# Patient Record
Sex: Male | Born: 1985 | Race: Black or African American | Hispanic: No | Marital: Single | Smoking: Never smoker
Health system: Southern US, Community
[De-identification: ages and names within clinical notes are randomized; demographics above are authoritative.]

## PROBLEM LIST (undated history)

## (undated) DIAGNOSIS — F1090 Alcohol use, unspecified, uncomplicated: Secondary | ICD-10-CM

## (undated) DIAGNOSIS — Z789 Other specified health status: Secondary | ICD-10-CM

## (undated) DIAGNOSIS — A630 Anogenital (venereal) warts: Secondary | ICD-10-CM

## (undated) DIAGNOSIS — J302 Other seasonal allergic rhinitis: Secondary | ICD-10-CM

## (undated) DIAGNOSIS — Z7289 Other problems related to lifestyle: Secondary | ICD-10-CM

## (undated) HISTORY — DX: Other specified health status: Z78.9

## (undated) HISTORY — DX: Other seasonal allergic rhinitis: J30.2

## (undated) HISTORY — DX: Other problems related to lifestyle: Z72.89

## (undated) HISTORY — DX: Alcohol use, unspecified, uncomplicated: F10.90

## (undated) HISTORY — DX: Anogenital (venereal) warts: A63.0

## (undated) HISTORY — PX: NO PAST SURGERIES: SHX2092

---

## 2010-08-23 ENCOUNTER — Emergency Department (HOSPITAL_COMMUNITY): Payer: Self-pay

## 2010-08-23 ENCOUNTER — Emergency Department (HOSPITAL_COMMUNITY)
Admission: EM | Admit: 2010-08-23 | Discharge: 2010-08-23 | Disposition: A | Discharge status: Home / Self Care | Payer: Self-pay | Attending: Emergency Medicine | Admitting: Emergency Medicine

## 2010-08-23 DIAGNOSIS — S61409A Unspecified open wound of unspecified hand, initial encounter: Secondary | ICD-10-CM | POA: Insufficient documentation

## 2010-08-23 DIAGNOSIS — W268XXA Contact with other sharp object(s), not elsewhere classified, initial encounter: Secondary | ICD-10-CM | POA: Insufficient documentation

## 2011-12-11 ENCOUNTER — Emergency Department (INDEPENDENT_AMBULATORY_CARE_PROVIDER_SITE_OTHER)
Admission: EM | Admit: 2011-12-11 | Discharge: 2011-12-11 | Disposition: A | Discharge status: Home / Self Care | Payer: 59 | Source: Home / Self Care | Attending: Family Medicine | Admitting: Family Medicine

## 2011-12-11 ENCOUNTER — Encounter (HOSPITAL_COMMUNITY): Payer: Self-pay | Admitting: Emergency Medicine

## 2011-12-11 DIAGNOSIS — N342 Other urethritis: Secondary | ICD-10-CM

## 2011-12-11 MED ORDER — LIDOCAINE HCL (PF) 1 % IJ SOLN
INTRAMUSCULAR | Status: AC
  Filled 2011-12-11: qty 5

## 2011-12-11 MED ORDER — CEFTRIAXONE SODIUM 250 MG IJ SOLR
250.0000 mg | Freq: Once | INTRAMUSCULAR | Status: AC
  Administered 2011-12-11: 250 mg via INTRAMUSCULAR

## 2011-12-11 MED ORDER — AZITHROMYCIN 250 MG PO TABS
ORAL_TABLET | ORAL | Status: AC
  Filled 2011-12-11: qty 4

## 2011-12-11 MED ORDER — CEFTRIAXONE SODIUM 250 MG IJ SOLR
INTRAMUSCULAR | Status: AC
  Filled 2011-12-11: qty 250

## 2011-12-11 MED ORDER — AZITHROMYCIN 250 MG PO TABS
1000.0000 mg | ORAL_TABLET | Freq: Once | ORAL | Status: AC
  Administered 2011-12-11: 1000 mg via ORAL

## 2011-12-11 NOTE — ED Notes (Signed)
Burning with urination.  Denies pain without urinating, denies penile discharge.  Patient reports having unprotected sex.

## 2011-12-11 NOTE — ED Notes (Signed)
Patient aware of post antibiotic injection delay prior to being discharged per policy

## 2011-12-11 NOTE — ED Provider Notes (Signed)
History     CSN: [id number]  Arrival date & time 12/11/11  1132   First MD Initiated Contact with Patient 12/11/11 1216      Chief Complaint  Patient presents with  . Urinary Tract Infection    (Consider location/radiation/quality/duration/timing/severity/associated sxs/prior treatment) HPI Comments: 26 year old male with no significant past medical history here complaining of burning on urination for 2 days. Reports more than 5 heterosexual partners during the last year although states that he had use condoms every time except with his last sexual partner during the last month. Denies prior history of sexually transmitted diseases. Declined HIV test.   History reviewed. No pertinent past medical history.  History reviewed. No pertinent past surgical history.  No family history on file.  History  Substance Use Topics  . Smoking status: Never Smoker   . Smokeless tobacco: Not on file  . Alcohol Use: Yes      Review of Systems  Constitutional: Negative for fever, chills, fatigue and unexpected weight change.  HENT: Negative for congestion, sore throat and neck pain.   Respiratory: Negative for cough and shortness of breath.   Gastrointestinal: Negative for nausea, vomiting, abdominal pain and diarrhea.  Genitourinary: Positive for dysuria, frequency and discharge. Negative for urgency, hematuria, flank pain, decreased urine volume, penile swelling, scrotal swelling, genital sores, penile pain and testicular pain.  Neurological: Negative for dizziness and headaches.  All other systems reviewed and are negative.    Allergies  Review of patient's allergies indicates no known allergies.  Home Medications  No current outpatient prescriptions on file.  BP 132/89  Pulse 53  Temp 98.1 F (36.7 C) (Oral)  Resp 18  SpO2 100%  Physical Exam  Nursing note and vitals reviewed. Constitutional: He is oriented to person, place, and time. He appears well-developed and  well-nourished. No distress.  HENT:  Head: Normocephalic and atraumatic.  Mouth/Throat: No oropharyngeal exudate.  Eyes: Conjunctivae are normal. No scleral icterus.  Neck: Neck supple.  Cardiovascular: Normal heart sounds.   Pulmonary/Chest: Breath sounds normal.  Abdominal: Soft. There is no tenderness. Hernia confirmed negative in the right inguinal area and confirmed negative in the left inguinal area.  Genitourinary: Testes normal. Right testis shows no mass, no swelling and no tenderness. Left testis shows no mass, no swelling and no tenderness. Circumcised. No phimosis, hypospadias or penile erythema. Discharge found.  Lymphadenopathy:    He has no cervical adenopathy.       Right: No inguinal adenopathy present.       Left: No inguinal adenopathy present.  Neurological: He is alert and oriented to person, place, and time.  Skin: No rash noted.    ED Course  Procedures (including critical care time)  Labs Reviewed - No data to display No results found.   1. Urethritis       MDM  Clinical findings consistent with urethritis. Treated empirically for gonorrhea and Chlamydia with Rocephin 250 mg IM x1 and a azithromycin 1 g oral today. GC and Chlamydia tests results pending at the time of discharge. Patient encouraged to use condoms consistently to prevent sexually transmitted diseases. Declined HIV test.        Sharin Grave, MD 12/13/11 1500

## 2011-12-15 ENCOUNTER — Emergency Department (INDEPENDENT_AMBULATORY_CARE_PROVIDER_SITE_OTHER)
Admission: EM | Admit: 2011-12-15 | Discharge: 2011-12-15 | Disposition: A | Discharge status: Home / Self Care | Payer: 59 | Source: Home / Self Care | Attending: Family Medicine | Admitting: Family Medicine

## 2011-12-15 ENCOUNTER — Encounter (HOSPITAL_COMMUNITY): Payer: Self-pay | Admitting: Emergency Medicine

## 2011-12-15 DIAGNOSIS — N341 Nonspecific urethritis: Secondary | ICD-10-CM

## 2011-12-15 LAB — POCT URINALYSIS DIP (DEVICE)
Ketones, ur: NEGATIVE mg/dL
Protein, ur: 100 mg/dL — AB
Specific Gravity, Urine: >=1.03 (ref 1.005–1.030)
Urobilinogen, UA: 0.2 mg/dL (ref 0.0–1.0)

## 2011-12-15 MED ORDER — DOXYCYCLINE HYCLATE 100 MG PO CAPS: 100.0000 mg | ORAL_CAPSULE | Freq: Two times a day (BID) | ORAL | Status: AC

## 2011-12-15 NOTE — ED Notes (Signed)
Burning with urination.  Reports not feeling any better since visit on 8/15.

## 2011-12-15 NOTE — ED Provider Notes (Signed)
History     CSN: [id number]  Arrival date & time 12/15/11  1714   First MD Initiated Contact with Patient 12/15/11 1716      Chief Complaint  Patient presents with  . Exposure to STD    (Consider location/radiation/quality/duration/timing/severity/associated sxs/prior treatment) Patient is a 26 y.o. male presenting with STD exposure. The history is provided by the patient.  Exposure to STD This is a recurrent problem. The current episode started more than 1 week ago (seen 8/15 for std and treated but sx continue of dysuria.). The problem has been gradually worsening.    History reviewed. No pertinent past medical history.  History reviewed. No pertinent past surgical history.  No family history on file.  History  Substance Use Topics  . Smoking status: Never Smoker   . Smokeless tobacco: Not on file  . Alcohol Use: Yes      Review of Systems  Constitutional: Negative.   Gastrointestinal: Negative.   Genitourinary: Positive for dysuria. Negative for urgency, discharge, penile swelling, scrotal swelling, penile pain and testicular pain.    Allergies  Review of patient's allergies indicates no known allergies.  Home Medications   Current Outpatient Rx  Name Route Sig Dispense Refill  . DOXYCYCLINE HYCLATE 100 MG PO CAPS Oral Take 1 capsule (100 mg total) by mouth 2 (two) times daily. 20 capsule 0    BP 150/96  Pulse 55  Temp 99.1 F (37.3 C) (Oral)  Resp 16  SpO2 100%  Physical Exam  Nursing note and vitals reviewed. Constitutional: He appears well-developed and well-nourished.  Abdominal: Soft. Bowel sounds are normal.  Genitourinary: Penis normal.    No penile tenderness.    ED Course  Procedures (including critical care time)  Labs Reviewed  POCT URINALYSIS DIP (DEVICE) - Abnormal; Notable for the following:    Hgb urine dipstick LARGE (*)     Protein, ur 100 (*)     All other components within normal limits   No results found.   1.  Urethritis, nonspecific       MDM          Linna Hoff, MD 12/15/11 1753

## 2012-01-02 IMAGING — CR DG HAND COMPLETE 3+V*L*
3 series · 3 of 3 positions shown · non-contrast
Comparison: None.

CLINICAL DATA: Laceration to left hand, just below the fifth
finger, extending to the wrist.

LEFT HAND - COMPLETE 3+ VIEW

[x hand pa right]
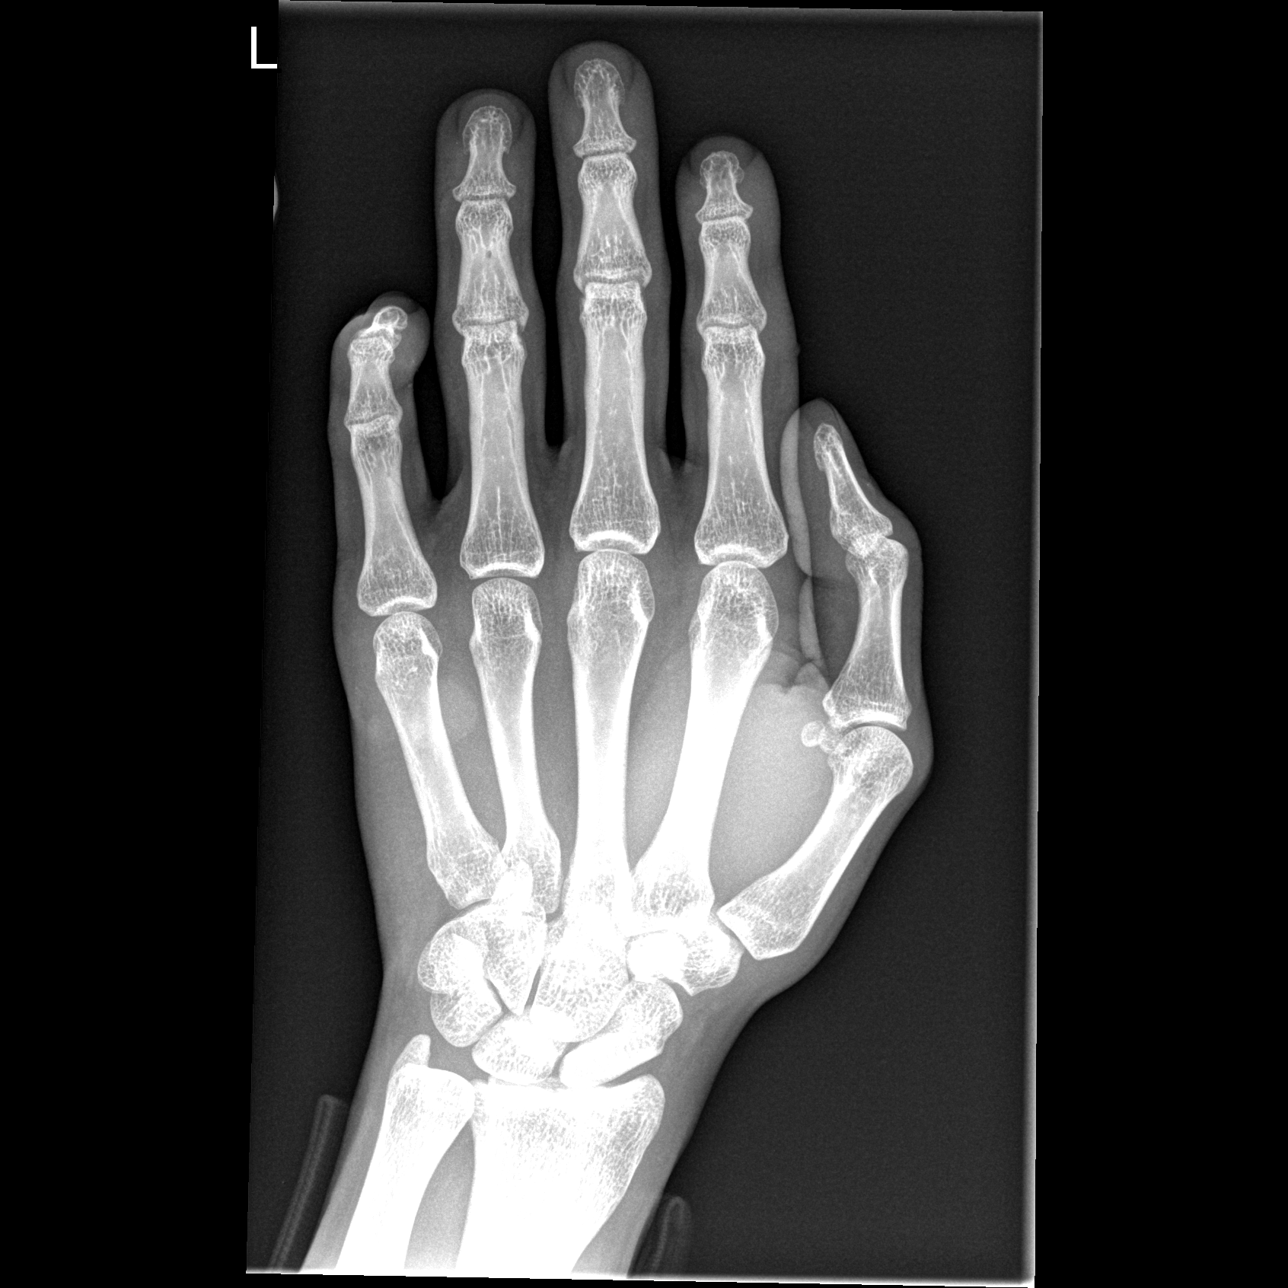

[x hand oblique right]
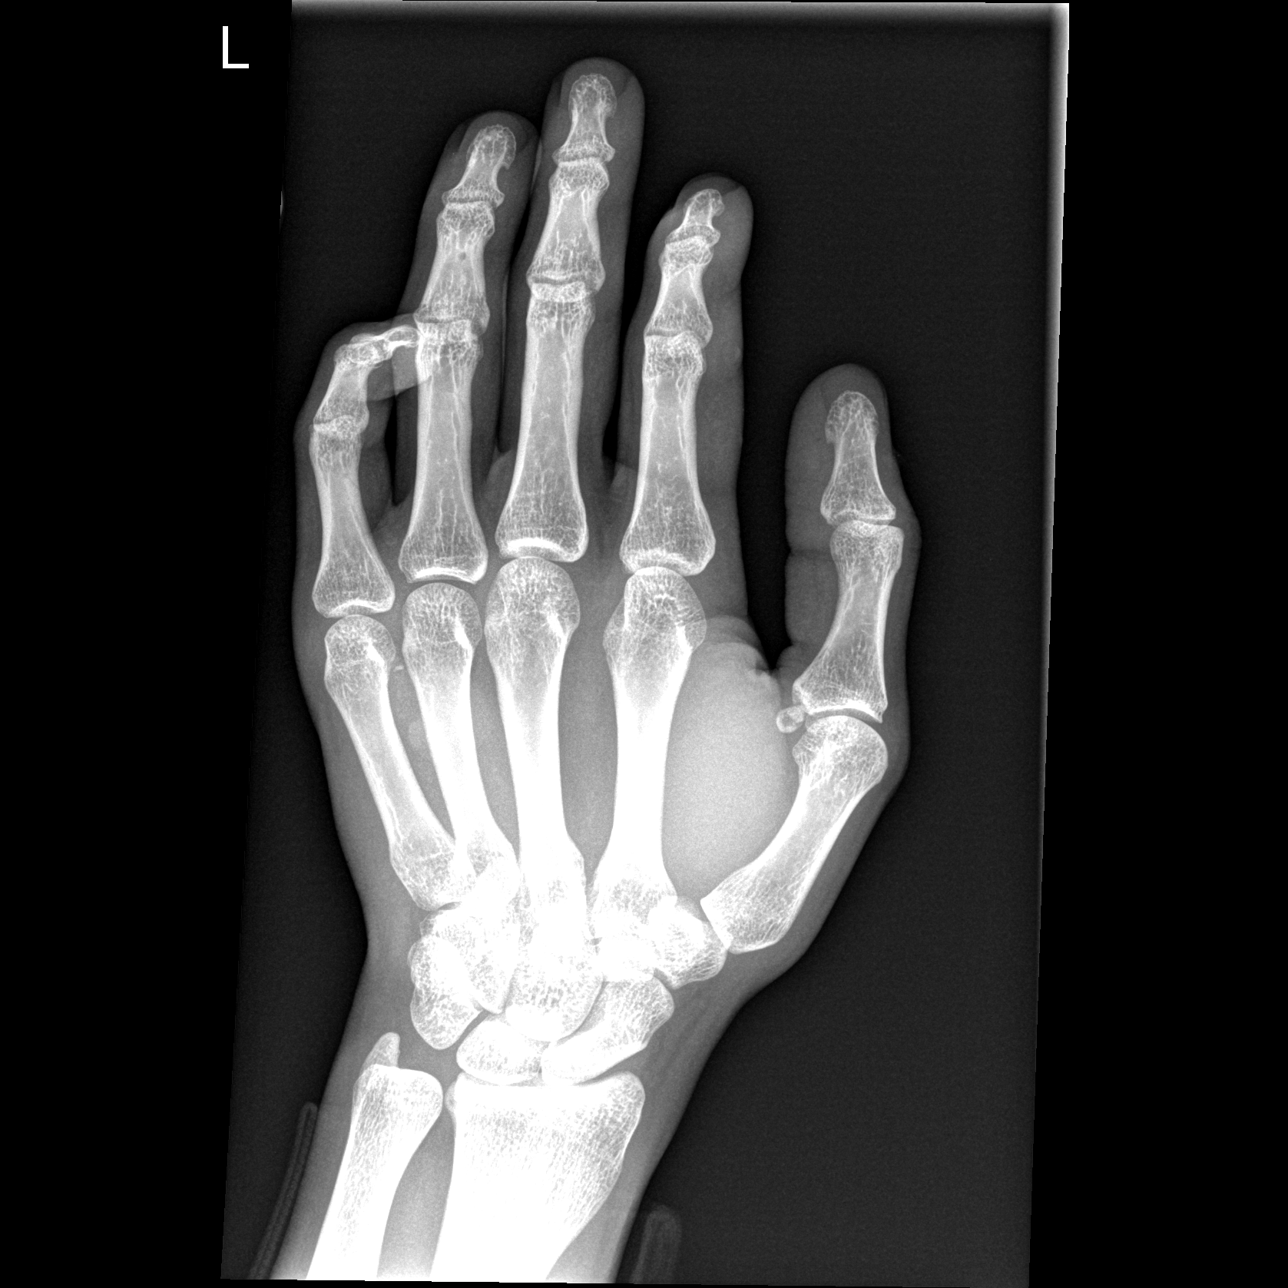

[x hand lat right]
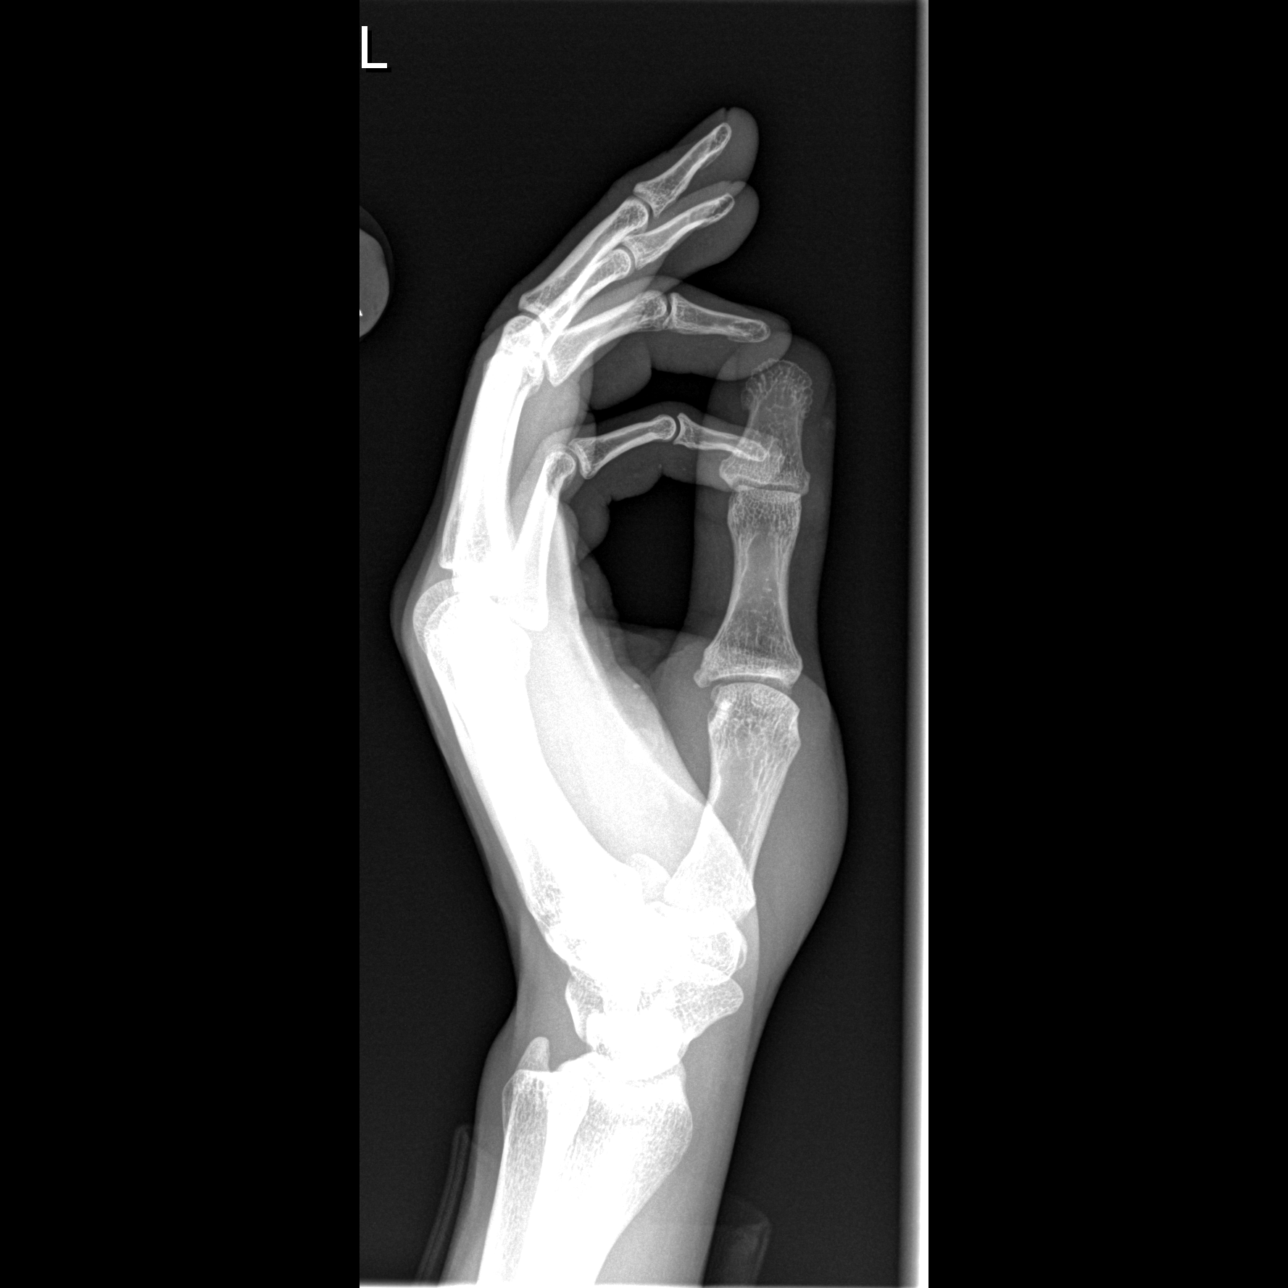

[3 of 3 positions shown; findings below may reference images not displayed]

FINDINGS: There is no evidence of fracture or dislocation.  The
joint spaces are preserved.  The carpal rows are intact, and
demonstrate normal alignment.

Two tiny radiopaque densities are noted at the expected location of
the distal aspect of the laceration, measuring less than 2 mm in
size.  There is also a rounded soft tissue density projecting volar
to the fifth metacarpal, possibly reflecting a soft tissue
hematoma.
IMPRESSION: 1.  No evidence of osseous disruption.
2.  Suspect two tiny radiopaque foreign bodies at the distal aspect
of the laceration, measuring less than 2 mm in size.  Small rounded
soft tissue density volar to the fifth metacarpal may reflect a
soft tissue hematoma.

## 2012-09-26 ENCOUNTER — Emergency Department (INDEPENDENT_AMBULATORY_CARE_PROVIDER_SITE_OTHER)
Admission: EM | Admit: 2012-09-26 | Discharge: 2012-09-26 | Disposition: A | Discharge status: Home / Self Care | Payer: 59 | Source: Home / Self Care | Attending: Family Medicine | Admitting: Family Medicine

## 2012-09-26 ENCOUNTER — Encounter (HOSPITAL_COMMUNITY): Payer: Self-pay | Admitting: Emergency Medicine

## 2012-09-26 DIAGNOSIS — J302 Other seasonal allergic rhinitis: Secondary | ICD-10-CM

## 2012-09-26 DIAGNOSIS — J309 Allergic rhinitis, unspecified: Secondary | ICD-10-CM

## 2012-09-26 MED ORDER — METHYLPREDNISOLONE ACETATE 40 MG/ML IJ SUSP
80.0000 mg | Freq: Once | INTRAMUSCULAR | Status: AC
  Administered 2012-09-26: 80 mg via INTRAMUSCULAR

## 2012-09-26 MED ORDER — FLUTICASONE PROPIONATE 50 MCG/ACT NA SUSP: 1.0000 | Freq: Two times a day (BID) | NASAL | Status: DC

## 2012-09-26 MED ORDER — METHYLPREDNISOLONE ACETATE 80 MG/ML IJ SUSP
INTRAMUSCULAR | Status: AC
  Filled 2012-09-26: qty 1

## 2012-09-26 MED ORDER — AZELASTINE HCL 0.15 % NA SOLN: 2.0000 | Freq: Two times a day (BID) | NASAL | Status: DC

## 2012-09-26 NOTE — ED Provider Notes (Signed)
History     CSN: [id number]  Arrival date & time 09/26/12  1103   First MD Initiated Contact with Patient 09/26/12 1138      Chief Complaint  Patient presents with  . Nasal Congestion    x 3 wks.     (Consider location/radiation/quality/duration/timing/severity/associated sxs/prior treatment) Patient is a 27 y.o. male presenting with URI. The history is provided by the patient.  URI Presenting symptoms: congestion, cough and rhinorrhea   Presenting symptoms: no fever and no sore throat   Severity:  Mild Duration:  3 weeks Progression:  Unchanged Chronicity:  New Ineffective treatments:  OTC medications Associated symptoms: sneezing   Associated symptoms: no wheezing     History reviewed. No pertinent past medical history.  History reviewed. No pertinent past surgical history.  History reviewed. No pertinent family history.  History  Substance Use Topics  . Smoking status: Never Smoker   . Smokeless tobacco: Not on file  . Alcohol Use: Yes      Review of Systems  Constitutional: Negative.  Negative for fever.  HENT: Positive for congestion, rhinorrhea, sneezing and postnasal drip. Negative for sore throat.   Respiratory: Positive for cough. Negative for wheezing.   Cardiovascular: Negative.     Allergies  Review of patient's allergies indicates no known allergies.  Home Medications   Current Outpatient Rx  Name  Route  Sig  Dispense  Refill  . Azelastine HCl 0.15 % SOLN   Nasal   Place 2 sprays into the nose 2 (two) times daily.   30 mL   1   . fluticasone (FLONASE) 50 MCG/ACT nasal spray   Nasal   Place 1 spray into the nose 2 (two) times daily.   1 g   2     BP 120/66  Pulse 64  Temp(Src) 97.8 F (36.6 C) (Oral)  Resp 16  SpO2 98%  Physical Exam  Nursing note and vitals reviewed. Constitutional: He is oriented to person, place, and time. He appears well-developed and well-nourished.  HENT:  Head: Normocephalic.  Right Ear: External  ear normal.  Left Ear: External ear normal.  Nose: Mucosal edema and rhinorrhea present.  Mouth/Throat: Oropharynx is clear and moist.  Neck: Normal range of motion. Neck supple.  Pulmonary/Chest: Effort normal and breath sounds normal.  Lymphadenopathy:    He has no cervical adenopathy.  Neurological: He is alert and oriented to person, place, and time.  Skin: Skin is warm and dry.    ED Course  Procedures (including critical care time)  Labs Reviewed - No data to display No results found.   1. Seasonal allergic rhinitis       MDM          Linna Hoff, MD 09/26/12 1208

## 2012-09-26 NOTE — ED Notes (Signed)
Pt c/o severe nasal congestion x 3 wks.   Pt states when he is able to blow nose mucus is dark yellow.   Hard to breath/sob.   Pt has tried multiple otc meds with no relief.

## 2012-09-26 NOTE — ED Notes (Signed)
Pt denies fever and any other symptoms.  

## 2015-10-15 ENCOUNTER — Telehealth: Payer: Self-pay

## 2015-10-15 NOTE — Telephone Encounter (Signed)
Called patient to complete Pre Visit. Voice mail not set up.

## 2015-10-16 ENCOUNTER — Encounter: Payer: Self-pay | Admitting: Physician Assistant

## 2015-10-16 ENCOUNTER — Ambulatory Visit (INDEPENDENT_AMBULATORY_CARE_PROVIDER_SITE_OTHER): Payer: BLUE CROSS/BLUE SHIELD | Admitting: Physician Assistant

## 2015-10-16 VITALS — BP 122/86 | HR 62 | Temp 98.0°F | Resp 16 | Ht 71.75 in | Wt 163.0 lb

## 2015-10-16 DIAGNOSIS — A630 Anogenital (venereal) warts: Secondary | ICD-10-CM | POA: Diagnosis not present

## 2015-10-16 DIAGNOSIS — F1010 Alcohol abuse, uncomplicated: Secondary | ICD-10-CM | POA: Diagnosis not present

## 2015-10-16 NOTE — Progress Notes (Signed)
Pre visit review using our clinic review tool, if applicable. No additional management support is needed unless otherwise documented below in the visit note/SLS  

## 2015-10-16 NOTE — Progress Notes (Signed)
Patient presents to clinic today to establish care.  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 Was unable to get treated due to insurance. Was previously treated with Condylox in a provider's office.   Endorses alcohol abuse. Drinking 1 pint of liquor per day over the past 2 years. Has never tried to quit. Is now entering into a rehabilitation program that requires he start to wean off of medication. Would like to discuss treatment options today.  C - yes A - no G - no E - yes   Past Medical History  Diagnosis Date  . Seasonal allergies   . Genital warts   . Alcohol use [facility](HCC)     Past Surgical History  Procedure Laterality Date  . No past surgeries      No current outpatient prescriptions on file prior to visit.   No current facility-administered medications on file prior to visit.    No Known Allergies  Family History  Problem Relation Age of Onset  . Hypertension Father     Living  . Healthy Mother     living  . Stroke Paternal Grandmother   . Hypertension Paternal Grandmother   . Hyperlipidemia Other     Paternal & Maternal Uncles  . Anxiety disorder Sister   . Healthy Sister     #2  . Healthy Son     x1  . Healthy Daughter     x1    Social History   Social History  . Marital Status: Single    Spouse Name: N/A  . Number of Children: N/A  . Years of Education: N/A   Occupational History  . Not on file.   Social History Main Topics  . Smoking status: Never Smoker   . Smokeless tobacco: Never Used  . Alcohol Use: 0.0 oz/week    0 Standard drinks or equivalent per week     Comment: liquor -- pint of liquor/day x 2 years  . Drug Use: Yes    Special: Marijuana  . Sexual Activity:    Partners: Female    CopyBirth Control/ Protection: None   Other Topics Concern  . Not on file   Social History Narrative   Review of Systems  Constitutional: Negative for fever and weight loss.    Gastrointestinal: Negative for heartburn, nausea, vomiting, abdominal pain, diarrhea, constipation, blood in stool and melena.  Psychiatric/Behavioral: Positive for substance abuse. Negative for depression, suicidal ideas and hallucinations. The patient is not nervous/anxious and does not have insomnia.    BP 122/86 mmHg  Pulse 62  Temp(Src) 98 F (36.7 C) (Oral)  Resp 16  Ht 5' 11.75" (1.822 m)  Wt 163 lb (73.936 kg)  BMI 22.27 kg/m2  SpO2 99%  Physical Exam  Constitutional: He is oriented to person, place, and time and well-developed, well-nourished, and in no distress.  HENT:  Head: Normocephalic and atraumatic.  Eyes: Conjunctivae are normal.  Neck: Neck supple.  Cardiovascular: Normal rate, regular rhythm, normal heart sounds and intact distal pulses.   Pulmonary/Chest: Effort normal and breath sounds normal. No respiratory distress. He has no wheezes. He has no rales. He exhibits no tenderness.  Genitourinary:  Multiple verrucous lesions noted of pubic region. 2 around 1 cm in diameter at the base of the penile shaft, 2 of R inguinal region and 1 0.5 cm lesion of distal penile shaft. No urethral warts noted. No scrotal lesions noted.  Neurological: He  is alert and oriented to person, place, and time.  Skin: Skin is warm and dry. No rash noted.  Psychiatric: Affect normal.  Vitals reviewed.  Assessment/Plan: Genital warts Cryotherapy applied to 5 lesions. Discussed treatment options with patient. Imiquimod will likely be subtherapeutic giving size of lesions. Also cost is an issue. Condylox has increased risk of scarring.  Cryotherapy would need staging. Recommended electrocautery. Referral to Dermatology placed for further treatment. Discussed safe sex practices.   Alcohol abuse Significant. Score of 2 on cage questionnaire. Discussed that inpatient care would be optimal giving extent of use coupled with risk of withdrawal. Also discussed outpatient treatment with Topamax and  other medications to help with cravings and any agitation from withdrawals. He has appointment with rehab program. Will speak with them first regarding inpatient versus outpatient treatment.     Piedad Climes, PA-C

## 2015-10-16 NOTE — Assessment & Plan Note (Signed)
Cryotherapy applied to 5 lesions. Discussed treatment options with patient. Imiquimod will likely be subtherapeutic giving size of lesions. Also cost is an issue. Condylox has increased risk of scarring.  Cryotherapy would need staging. Recommended electrocautery. Referral to Dermatology placed for further treatment. Discussed safe sex practices.

## 2015-10-16 NOTE — Assessment & Plan Note (Signed)
Significant. Score of 2 on cage questionnaire. Discussed that inpatient care would be optimal giving extent of use coupled with risk of withdrawal. Also discussed outpatient treatment with Topamax and other medications to help with cravings and any agitation from withdrawals. He has appointment with rehab program. Will speak with them first regarding inpatient versus outpatient treatment.

## 2015-10-16 NOTE — Patient Instructions (Signed)
Please think about the options we discussed for alcohol use.  We need to wean off of this in a safe matter. I do think that inpatient treatment would be the safest way to quickly cease drinking.  For outpatient treatment, we could start Topamax to help curb your cravings. Other medications could be given at low doses for a short-time to help with any withdrawal agitation.  There is a medication called Disulfiram that we could give that would help stop drinking. Unfortunately it works by making you very sick if you have a drink. Do not think that is a great options.  Follow-up with your probation program and AA classes.  Please let me know quickly how you would like to proceed.  For the warts -- we applied cryotherapy today. This will take several treatments every 1-2 weeks for a couple of months. I am setting you up with Dermatology for further management as I think you would benefit from removal via electrocautery.  Genital Warts Genital warts are small growths in the genital area or anal area. They are caused by a type of germ (HPV virus). This germ is spread from person to person during sex. It can be spread through vaginal, anal, and oral sex. Genital warts can lead to other problems if they are not treated.  HOME CARE Medicines  Apply over-the-counter and prescription medicines only as told by your doctor.  Do not use medicines that are meant for treating hand warts.  Talk with your doctor about using anti-itch creams. General Instructions  Do not touch or scratch the warts.  Do not have sex until your treatment is done.  Tell your current and past sexual partners about your condition. They may need treatment.  Keep all follow-up visits as told by your doctor. This is important.  After treatment, use condoms during sex. Other Instructions for Women  Women who have genital warts may need to be checked more often for cervical cancer.  If you become pregnant, tell your doctor  that you have had genital warts. The germ can be passed to the baby. GET HELP IF:  You have redness, swelling, or pain in the area of the treated skin.  You have a fever.  You feel generally sick.  You feel lumps in and around your genital area or anal area.  You have bleeding in your genital area or anal area.  You have pain during sex.   This information is not intended to replace advice given to you by your health care provider. Make sure you discuss any questions you have with your health care provider.   Document Released: 07/09/2009 Document Revised: 01/03/2015 Document Reviewed: 07/10/2014 Elsevier Interactive Patient Education [employer]2016 [employer] Inc.

## 2015-11-20 ENCOUNTER — Encounter: Payer: Self-pay | Admitting: Physician Assistant

## 2015-11-20 ENCOUNTER — Ambulatory Visit (INDEPENDENT_AMBULATORY_CARE_PROVIDER_SITE_OTHER): Payer: BLUE CROSS/BLUE SHIELD | Admitting: Physician Assistant

## 2015-11-20 VITALS — BP 126/86 | HR 63 | Temp 98.4°F | Resp 16 | Ht 72.0 in | Wt 164.4 lb

## 2015-11-20 DIAGNOSIS — A630 Anogenital (venereal) warts: Secondary | ICD-10-CM

## 2015-11-20 NOTE — Progress Notes (Signed)
   Patient presents to clinic today for follow-up of genital warts. At last visit cryotherapy was applied to 3 verrucous lesions (2 R inguinal region, 1 penile shaft). Endorses some improvement in size of lesions, but only slightly so. Would like to discuss other options. Denies new or worsening lesions.   Past Medical History:  Diagnosis Date  . Alcohol use (HCC)   . Genital warts   . Seasonal allergies     No current outpatient prescriptions on file prior to visit.   No current facility-administered medications on file prior to visit.     No Known Allergies  Family History  Problem Relation Age of Onset  . Hypertension Father     Living  . Healthy Mother     living  . Stroke Paternal Grandmother   . Hypertension Paternal Grandmother   . Hyperlipidemia Other     Paternal & Maternal Uncles  . Anxiety disorder Sister   . Healthy Sister     #2  . Healthy Son     x1  . Healthy Daughter     x1    Social History   Social History  . Marital status: Single    Spouse name: N/A  . Number of children: N/A  . Years of education: N/A   Social History Main Topics  . Smoking status: Never Smoker  . Smokeless tobacco: Never Used  . Alcohol use 0.0 oz/week     Comment: liquor -- pint of liquor/day x 2 years  . Drug use:     Types: Marijuana  . Sexual activity: Yes    Partners: Female    Birth control/ protection: None   Other Topics Concern  . None   Social History Narrative  . None   Review of Systems - See HPI.  All other ROS are negative.  BP 126/86 (BP Location: Right Arm, Patient Position: Sitting, Cuff Size: Normal)   Pulse 63   Temp 98.4 F (36.9 C) (Oral)   Resp 16   Ht 6' (1.829 m)   Wt 164 lb 6 oz (74.6 kg)   SpO2 99%   BMI 22.29 kg/m   Physical Exam  Constitutional: He is oriented to person, place, and time and well-developed, well-nourished, and in no distress.  HENT:  Head: Normocephalic and atraumatic.  Eyes: Conjunctivae are normal.  Neck:  Neck supple.  Cardiovascular: Normal rate, regular rhythm, normal heart sounds and intact distal pulses.   Pulmonary/Chest: Effort normal.  Genitourinary: Penis exhibits lesions.  Genitourinary Comments: Again 2 verrucous lesion noted of R inguinal region. 1 similar lesion, about 4 mm in diameter, noted of penile shaft.  Neurological: He is alert and oriented to person, place, and time.  Skin: Skin is warm and dry.  Psychiatric: Affect normal.  Vitals reviewed.  Assessment/Plan: 1. Genital warts Discussed options. Believe again electrocautery is the best option in both cost and effectiveness. Replaced referral to Dermatology. Cryotherapy applied one more time to each lesion to help start facilitating regression.   - Ambulatory referral to Dermatology   Piedad Climes, PA-C

## 2015-11-20 NOTE — Patient Instructions (Signed)
Keep your phone on. If you do not here from the Dermatologist office within 48 hours for an appointment give me a call. Keep up the good work with your alcohol cessation efforts. Please give some thought to medication to help with cravings. I am very impressed with how you have done so far.  Follow-up at your convenience for a full physical examination.

## 2015-11-20 NOTE — Progress Notes (Signed)
Pre visit review using our clinic review tool, if applicable. No additional management support is needed unless otherwise documented below in the visit note/SLS  

## 2017-09-10 ENCOUNTER — Encounter: Payer: Self-pay | Admitting: Emergency Medicine

## 2020-06-11 ENCOUNTER — Ambulatory Visit (HOSPITAL_COMMUNITY)
Admission: EM | Admit: 2020-06-11 | Discharge: 2020-06-11 | Disposition: A | Discharge status: Other Acute Inpatient Hospital | Payer: No Payment, Other

## 2020-06-11 ENCOUNTER — Emergency Department (HOSPITAL_COMMUNITY)
Admission: EM | Admit: 2020-06-11 | Discharge: 2020-06-14 | Disposition: A | Discharge status: Home / Self Care | Payer: BLUE CROSS/BLUE SHIELD | Attending: Emergency Medicine | Admitting: Emergency Medicine

## 2020-06-11 ENCOUNTER — Encounter (HOSPITAL_COMMUNITY): Payer: Self-pay | Admitting: Emergency Medicine

## 2020-06-11 ENCOUNTER — Other Ambulatory Visit: Payer: Self-pay

## 2020-06-11 DIAGNOSIS — R4689 Other symptoms and signs involving appearance and behavior: Secondary | ICD-10-CM

## 2020-06-11 DIAGNOSIS — Z046 Encounter for general psychiatric examination, requested by authority: Secondary | ICD-10-CM | POA: Insufficient documentation

## 2020-06-11 DIAGNOSIS — F332 Major depressive disorder, recurrent severe without psychotic features: Secondary | ICD-10-CM | POA: Insufficient documentation

## 2020-06-11 DIAGNOSIS — F1024 Alcohol dependence with alcohol-induced mood disorder: Secondary | ICD-10-CM | POA: Insufficient documentation

## 2020-06-11 DIAGNOSIS — R45851 Suicidal ideations: Secondary | ICD-10-CM | POA: Insufficient documentation

## 2020-06-11 DIAGNOSIS — R45850 Homicidal ideations: Secondary | ICD-10-CM

## 2020-06-11 DIAGNOSIS — Z20822 Contact with and (suspected) exposure to covid-19: Secondary | ICD-10-CM | POA: Insufficient documentation

## 2020-06-11 DIAGNOSIS — F1010 Alcohol abuse, uncomplicated: Secondary | ICD-10-CM | POA: Diagnosis present

## 2020-06-11 DIAGNOSIS — R456 Violent behavior: Secondary | ICD-10-CM | POA: Insufficient documentation

## 2020-06-11 DIAGNOSIS — F1994 Other psychoactive substance use, unspecified with psychoactive substance-induced mood disorder: Secondary | ICD-10-CM

## 2020-06-11 LAB — CBC
HCT: 46.1 % (ref 39.0–52.0)
Hemoglobin: 16.8 g/dL (ref 13.0–17.0)
MCH: 35.2 pg — ABNORMAL HIGH (ref 26.0–34.0)
MCHC: 36.4 g/dL — ABNORMAL HIGH (ref 30.0–36.0)
MCV: 96.6 fL (ref 80.0–100.0)
Platelets: 334 10*3/uL (ref 150–400)
RBC: 4.77 MIL/uL (ref 4.22–5.81)
RDW: 12.2 % (ref 11.5–15.5)
WBC: 8.4 10*3/uL (ref 4.0–10.5)
nRBC: 0 % (ref 0.0–0.2)

## 2020-06-11 LAB — COMPREHENSIVE METABOLIC PANEL
ALT: 20 U/L (ref 0–44)
AST: 34 U/L (ref 15–41)
Albumin: 4.6 g/dL (ref 3.5–5.0)
Alkaline Phosphatase: 59 U/L (ref 38–126)
Anion gap: 15 (ref 5–15)
BUN: 6 mg/dL (ref 6–20)
CO2: 24 mmol/L (ref 22–32)
Calcium: 9.8 mg/dL (ref 8.9–10.3)
Chloride: 101 mmol/L (ref 98–111)
Creatinine, Ser: 1.05 mg/dL (ref 0.61–1.24)
GFR, Estimated: >60 mL/min (ref >60)
Glucose, Bld: 114 mg/dL — ABNORMAL HIGH (ref 70–99)
Potassium: 3.3 mmol/L — ABNORMAL LOW (ref 3.5–5.1)
Sodium: 140 mmol/L (ref 135–145)
Total Bilirubin: 1.3 mg/dL — ABNORMAL HIGH (ref 0.3–1.2)
Total Protein: 8.1 g/dL (ref 6.5–8.1)

## 2020-06-11 LAB — ETHANOL: Alcohol, Ethyl (B): 247 mg/dL — ABNORMAL HIGH (ref <10)

## 2020-06-11 LAB — ACETAMINOPHEN LEVEL: Acetaminophen (Tylenol), Serum: <10 ug/mL — ABNORMAL LOW (ref 10–30)

## 2020-06-11 LAB — SALICYLATE LEVEL: Salicylate Lvl: <7 mg/dL — ABNORMAL LOW (ref 7.0–30.0)

## 2020-06-11 MED ORDER — ZIPRASIDONE MESYLATE 20 MG IM SOLR: 20.0000 mg | Freq: Once | INTRAMUSCULAR | Status: AC

## 2020-06-11 MED ORDER — ZIPRASIDONE MESYLATE 20 MG IM SOLR
INTRAMUSCULAR | Status: AC
  Administered 2020-06-11: 20 mg via INTRAMUSCULAR
  Filled 2020-06-11: qty 20

## 2020-06-11 MED ORDER — STERILE WATER FOR INJECTION IJ SOLN
INTRAMUSCULAR | Status: AC
  Filled 2020-06-11: qty 10

## 2020-06-11 NOTE — ED Notes (Signed)
Patient angry and cussing. When asked for a urine sample patient states "what the f** do you need that for im not giving you shit."

## 2020-06-11 NOTE — ED Provider Notes (Signed)
[facility] EMERGENCY DEPARTMENT Provider Note   CSN: [id number] Arrival date & time: 06/11/20  2232     History Chief Complaint  Patient presents with  . IVC/Suicidal    Handcuffed w/ [employer]    Todd Matthews is a 35 y.o. male.  Patient to ED under IVC, accompanied by [employer], restrained due to aggressive behavior, HI, verbally abusive and threatening behavior. IVC petition filed by mother reporting he expressed SI today with plan that patient will not disclose, HI stating he wanted to kill his parents.   The history is provided by the patient. No language interpreter was used.       Past Medical History:  Diagnosis Date  . Alcohol use   . Genital warts   . Seasonal allergies     Patient Active Problem List   Diagnosis Date Noted  . Alcohol abuse 10/16/2015  . Genital warts 10/16/2015    Past Surgical History:  Procedure Laterality Date  . NO PAST SURGERIES         Family History  Problem Relation Age of Onset  . Hypertension Father        Living  . Healthy Mother        living  . Stroke Paternal Grandmother   . Hypertension Paternal Grandmother   . Hyperlipidemia Other        Paternal & Maternal Uncles  . Anxiety disorder Sister   . Healthy Sister        #2  . Healthy Son        x1  . Healthy Daughter        x1    Social History   Tobacco Use  . Smoking status: Never Smoker  . Smokeless tobacco: Never Used  Substance Use Topics  . Alcohol use: Yes    Alcohol/week: 0.0 standard drinks    Comment: liquor -- pint of liquor/day x 2 years  . Drug use: Yes    Types: Marijuana    Home Medications Prior to Admission medications   Not on File    Allergies    Patient has no known allergies.  Review of Systems   Review of Systems  Unable to perform ROS: Psychiatric disorder    Physical Exam Updated Vital Signs BP (!) 156/105 (BP Location: Left Arm)   Pulse (!) 101   Temp 98 F (36.7 C) (Oral)   Resp 18   SpO2 98%    Physical Exam Vitals and nursing note reviewed.  Constitutional:      Appearance: He is well-developed and well-nourished.  Pulmonary:     Effort: Pulmonary effort is normal.  Musculoskeletal:        General: Normal range of motion.     Cervical back: Normal range of motion.  Skin:    General: Skin is warm and dry.  Neurological:     Mental Status: He is alert.  Psychiatric:        Mood and Affect: Affect is angry.        Behavior: Behavior is agitated, aggressive and combative.     ED Results / Procedures / Treatments   Labs (all labs ordered are listed, but only abnormal results are displayed) Labs Reviewed  COMPREHENSIVE METABOLIC PANEL - Abnormal; Notable for the following components:      Result Value   Potassium 3.3 (*)    Glucose, Bld 114 (*)    Total Bilirubin 1.3 (*)    All other components within normal limits  ETHANOL - Abnormal; Notable for the following components:   Alcohol, Ethyl (B) 247 (*)    All other components within normal limits  SALICYLATE LEVEL - Abnormal; Notable for the following components:   Salicylate Lvl <7.0 (*)    All other components within normal limits  ACETAMINOPHEN LEVEL - Abnormal; Notable for the following components:   Acetaminophen (Tylenol), Serum <10 (*)    All other components within normal limits  CBC - Abnormal; Notable for the following components:   MCH 35.2 (*)    MCHC 36.4 (*)    All other components within normal limits  RAPID URINE DRUG SCREEN, HOSP PERFORMED   Results for orders placed or performed during the hospital encounter of 06/11/20  Comprehensive metabolic panel  Result Value Ref Range   Sodium 140 135 - 145 mmol/L   Potassium 3.3 (L) 3.5 - 5.1 mmol/L   Chloride 101 98 - 111 mmol/L   CO2 24 22 - 32 mmol/L   Glucose, Bld 114 (H) 70 - 99 mg/dL   BUN 6 6 - 20 mg/dL   Creatinine, Ser 6.60 0.61 - 1.24 mg/dL   Calcium 9.8 8.9 - 63.0 mg/dL   Total Protein 8.1 6.5 - 8.1 g/dL   Albumin 4.6 3.5 - 5.0 g/dL    AST 34 15 - 41 U/L   ALT 20 0 - 44 U/L   Alkaline Phosphatase 59 38 - 126 U/L   Total Bilirubin 1.3 (H) 0.3 - 1.2 mg/dL   GFR, Estimated >16 >01 mL/min   Anion gap 15 5 - 15  Ethanol  Result Value Ref Range   Alcohol, Ethyl (B) 247 (H) <10 mg/dL  Salicylate level  Result Value Ref Range   Salicylate Lvl <7.0 (L) 7.0 - 30.0 mg/dL  Acetaminophen level  Result Value Ref Range   Acetaminophen (Tylenol), Serum <10 (L) 10 - 30 ug/mL  cbc  Result Value Ref Range   WBC 8.4 4.0 - 10.5 K/uL   RBC 4.77 4.22 - 5.81 MIL/uL   Hemoglobin 16.8 13.0 - 17.0 g/dL   HCT 09.3 23.5 - 57.3 %   MCV 96.6 80.0 - 100.0 fL   MCH 35.2 (H) 26.0 - 34.0 pg   MCHC 36.4 (H) 30.0 - 36.0 g/dL   RDW 22.0 25.4 - 27.0 %   Platelets 334 150 - 400 K/uL   nRBC 0.0 0.0 - 0.2 %    EKG None  Radiology No results found.  Procedures Procedures  CRITICAL CARE Performed by: Arnoldo Hooker   Total critical care time: 45 minutes  Critical care time was exclusive of separately billable procedures and treating other patients.  Critical care was necessary to treat or prevent imminent or life-threatening deterioration.  Critical care was time spent personally by me on the following activities: development of treatment plan with patient and/or surrogate as well as nursing, discussions with consultants, evaluation of patient's response to treatment, examination of patient, obtaining history from patient or surrogate, ordering and performing treatments and interventions, ordering and review of laboratory studies, ordering and review of radiographic studies, pulse oximetry and re-evaluation of patient's condition.  Medications Ordered in ED Medications  ziprasidone (GEODON) 20 MG injection (has no administration in time range)  sterile water (preservative free) injection (has no administration in time range)    ED Course  I have reviewed the triage vital signs and the nursing notes.  Pertinent labs & imaging  results that were available during my care of the patient were  reviewed by me and considered in my medical decision making (see chart for details).  Clinical Course as of 06/12/20 0601  Tue Jun 12, 2020  0115 Patient sedated. VSS on the monitor. Handcuffs removed and he is placed in soft restraints. TTS involved in patient care.  [SU]  0240 Patient continues to sleep. VSS stable.   [SU]  0429 VSS on monitor. Patient still sleeping, NAD. Waiting for inpatient bed placement.   [SU]  [id number] Patient continues sleeping, changes position occasionally. VSS. Patient care signed out to oncoming provider team - awaiting placement.  [SU]    Clinical Course User Index [SU] Elpidio Anis, PA-C   MDM Rules/Calculators/A&P                          Patient to ED with [employer] under IVC for SI/HI, currently combative, aggressive, verbally aggressive. Geodon provided, 20 mg.   Patient has been sedated, stable, monitored throughout the shift. VSS. Per TTS, meets inpatient criteria. No beds at [facility]. Placement will be sought at another facility.     Final Clinical Impression(s) / ED Diagnoses Final diagnoses:  None   1. IVC 2. Aggressive behavior 3. SI 4. HI  Rx / DC Orders ED Discharge Orders    None       Danne Harbor 06/12/20 0601    Zadie Rhine, MD 06/12/20 [phone]

## 2020-06-11 NOTE — ED Triage Notes (Signed)
Patient arrived with 2 [employer] officers handcuffed  IVC due to suicidal ideation ( did not disclose plan) and homicidal ideation towards parents, verbally abusive with staff at arrival .

## 2020-06-11 NOTE — Progress Notes (Signed)
Unable to obtain vital signs, unable to assess patient- patient unable to tolerate removal of handcuffs - threatening Designer, television/film set

## 2020-06-12 DIAGNOSIS — F1994 Other psychoactive substance use, unspecified with psychoactive substance-induced mood disorder: Secondary | ICD-10-CM

## 2020-06-12 DIAGNOSIS — F1010 Alcohol abuse, uncomplicated: Secondary | ICD-10-CM

## 2020-06-12 LAB — RAPID URINE DRUG SCREEN, HOSP PERFORMED
Amphetamines: NOT DETECTED
Barbiturates: NOT DETECTED
Benzodiazepines: NOT DETECTED
Cocaine: NOT DETECTED
Opiates: NOT DETECTED
Tetrahydrocannabinol: POSITIVE — AB

## 2020-06-12 LAB — RESP PANEL BY RT-PCR (FLU A&B, COVID) ARPGX2
Influenza A by PCR: NEGATIVE
Influenza B by PCR: NEGATIVE
SARS Coronavirus 2 by RT PCR: NEGATIVE

## 2020-06-12 MED ORDER — OLANZAPINE 5 MG PO TBDP: 10.0000 mg | ORAL_TABLET | Freq: Three times a day (TID) | ORAL | Status: DC | PRN

## 2020-06-12 MED ORDER — STERILE WATER FOR INJECTION IJ SOLN
INTRAMUSCULAR | Status: AC
  Filled 2020-06-12: qty 10

## 2020-06-12 MED ORDER — LORAZEPAM 1 MG PO TABS
1.0000 mg | ORAL_TABLET | Freq: Four times a day (QID) | ORAL | Status: DC | PRN
  Administered 2020-06-13: 1 mg via ORAL
  Filled 2020-06-12: qty 1

## 2020-06-12 MED ORDER — ONDANSETRON 4 MG PO TBDP: 4.0000 mg | ORAL_TABLET | Freq: Four times a day (QID) | ORAL | Status: DC | PRN

## 2020-06-12 MED ORDER — LORAZEPAM 2 MG/ML IJ SOLN: 1.0000 mg | Freq: Four times a day (QID) | INTRAMUSCULAR | Status: DC | PRN

## 2020-06-12 MED ORDER — THIAMINE HCL 100 MG PO TABS
100.0000 mg | ORAL_TABLET | Freq: Every day | ORAL | Status: DC
  Administered 2020-06-13: 100 mg via ORAL
  Filled 2020-06-12: qty 1

## 2020-06-12 MED ORDER — ZIPRASIDONE MESYLATE 20 MG IM SOLR: 20.0000 mg | INTRAMUSCULAR | Status: DC | PRN

## 2020-06-12 MED ORDER — ZIPRASIDONE MESYLATE 20 MG IM SOLR
20.0000 mg | Freq: Once | INTRAMUSCULAR | Status: AC
  Administered 2020-06-12: 20 mg via INTRAMUSCULAR
  Filled 2020-06-12: qty 20

## 2020-06-12 MED ORDER — HYDROXYZINE HCL 25 MG PO TABS
25.0000 mg | ORAL_TABLET | Freq: Four times a day (QID) | ORAL | Status: DC | PRN
  Administered 2020-06-12: 25 mg via ORAL
  Filled 2020-06-12: qty 1

## 2020-06-12 MED ORDER — LOPERAMIDE HCL 2 MG PO CAPS: 2.0000 mg | ORAL_CAPSULE | ORAL | Status: DC | PRN

## 2020-06-12 MED ORDER — ADULT MULTIVITAMIN W/MINERALS CH
1.0000 | ORAL_TABLET | Freq: Every day | ORAL | Status: DC
  Administered 2020-06-12 – 2020-06-13 (×2): 1 via ORAL
  Filled 2020-06-12 (×2): qty 1

## 2020-06-12 MED ORDER — ZIPRASIDONE MESYLATE 20 MG IM SOLR
20.0000 mg | Freq: Every day | INTRAMUSCULAR | Status: DC | PRN
Start: 2020-06-12 — End: 2020-06-14

## 2020-06-12 NOTE — Consult Note (Signed)
Telepsych Consultation   Location of Patient: [facility] Location of Provider: [facility]  Patient Identification: Todd Matthews MRN:  [id number] Principal Diagnosis: Alcohol abuse Diagnosis:  Principal Problem:   Alcohol abuse Active Problems:   Substance induced mood disorder (HCC)   Total Time spent with patient: 20 minutes  HPI:  Reassessment: Patient seen via telepsych. Chart reviewed. Todd Matthews is a 35 year old male with no reported psychiatric history who presented yesterday under IVC from his mother for SI and HI toward family members. Patient has been agitated in the ED, requiring IM Geodon x2 last night and today.  Patient minimizes concerns as reported in previous notes and IVC paperwork. He admits to drinking a fifth of liquor daily for years but denies feeling this is a problem. He admits to [facility] but denies plan or intent. Denies HI and allegations of HI on IVC paperwork. He admits to having a bad day yesterday but unable to say what happened.   Per mother/IVC petitioner Concha Se patient goes through time periods where he becomes belligerent, aggressive and does not sleep well. Yesterday he was threatening suicide and giving graphic descriptions of killing the family, threatening to drive by the house and kill everyone. He was reporting seeing demons and telling her to get him a gun. She reports safety concerns for discharge home today.  Per TTS assessment: Todd Matthews is a 35 year old patient who was brought to [facility] by the [employer] under an IVC order. The IVC paperwork states:  "Respondent told his parents today that he wanted to kill himself and stated he wanted to kill them also. Stated to family members he called the suicide hotline twice today. Has a specific plan to commit suicide. Abuses alcohol daily. Is a danger to himself and others."  When asked why pt was at the hospital tonight, pt stated, "Because my family is a piece of shit. I wanted some type of  therapeutic help." When asked what type of help pt was hoping to obtain he stated he did not know.  Pt acknowledges he experiences SI daily, though he states he does not currently have a plan. He denies he's ever attempted to kill himself or that he's ever been hospitalized for mental health concerns. Pt denies HI, AVH, NSSIB, or access to guns/weapons. Pt acknowledges he has court on July 22, 2020 for a DWI. He states he engages in the use of EtOH on a daily basis, though he was not able to give an estimate of how much he drinks. Pt states he last drank today (06/11/2020). At 2341 his BAL was 247.  Pt's protective factors include no AVH and his family's concern for his well-being.  Pt gave verbal consent to contact his mother, Todd Matthews, who also filed the IVC ppwk on pt. She states pt is, "to the point where I think he's having a nervous breakdown. He is an alcoholic." She states pt has been making threats to kill himself and kill others. She state that life in general has been stressful for pt and that it's been occurring for "well over a year." She reports pt has made comments about wishing he was dead or that he could kill himself, stating, "I wish I could get ahold of a gun." Pt's mother states she is "most definitely" concerned for pt's safety and the safety of others.  Pt's orientation was [facility]. Pt's memory was [facility]. Pt was distracted by being handcuffed and wanting to lie down and go to sleep but, overall,  was cooperative throughout the assessment process. Pt's insight, judgement, and impulse control is poor at this time.  Disposition: Continue to recommend inpatient treatment. PRN medications ordered for withdrawal from ETOH and agitation. ED staff updated. CSW updated for bed placement.  Past Psychiatric History: See above  Risk to Self:   Risk to Others:   Prior Inpatient Therapy:   Prior Outpatient Therapy:    Past Medical History:  Past Medical History:  Diagnosis Date  .  Alcohol use   . Genital warts   . Seasonal allergies     Past Surgical History:  Procedure Laterality Date  . NO PAST SURGERIES     Family History:  Family History  Problem Relation Age of Onset  . Hypertension Father        Living  . Healthy Mother        living  . Stroke Paternal Grandmother   . Hypertension Paternal Grandmother   . Hyperlipidemia Other        Paternal & Maternal Uncles  . Anxiety disorder Sister   . Healthy Sister        #2  . Healthy Son        x1  . Healthy Daughter        x1   Family Psychiatric  History: Reports family history of alcohol abuse Social History:  Social History   Substance and Sexual Activity  Alcohol Use Yes  . Alcohol/week: 0.0 standard drinks   Comment: liquor -- pint of liquor/day x 2 years     Social History   Substance and Sexual Activity  Drug Use Yes  . Types: Marijuana    Social History   Socioeconomic History  . Marital status: Single    Spouse name: Not on file  . Number of children: Not on file  . Years of education: Not on file  . Highest education level: Not on file  Occupational History  . Not on file  Tobacco Use  . Smoking status: Never Smoker  . Smokeless tobacco: Never Used  Substance and Sexual Activity  . Alcohol use: Yes    Alcohol/week: 0.0 standard drinks    Comment: liquor -- pint of liquor/day x 2 years  . Drug use: Yes    Types: Marijuana  . Sexual activity: Yes    Partners: Female    Birth control/protection: None  Other Topics Concern  . Not on file  Social History Narrative  . Not on file   Social Determinants of Health   Financial Resource Strain: Not on file  Food Insecurity: Not on file  Transportation Needs: Not on file  Physical Activity: Not on file  Stress: Not on file  Social Connections: Not on file   Additional Social History:    Allergies:  No Known Allergies  Labs:  Results for orders placed or performed during the hospital encounter of 06/11/20 (from the  past 48 hour(s))  Comprehensive metabolic panel     Status: Abnormal   Collection Time: 06/11/20 10:48 PM  Result Value Ref Range   Sodium 140 135 - 145 mmol/L   Potassium 3.3 (L) 3.5 - 5.1 mmol/L   Chloride 101 98 - 111 mmol/L   CO2 24 22 - 32 mmol/L   Glucose, Bld 114 (H) 70 - 99 mg/dL    Comment: Glucose reference range applies only to samples taken after fasting for at least 8 hours.   BUN 6 6 - 20 mg/dL   Creatinine, Ser 4.40 0.61 -  1.24 mg/dL   Calcium 9.8 8.9 - 29.510.3 mg/dL   Total Protein 8.1 6.5 - 8.1 g/dL   Albumin 4.6 3.5 - 5.0 g/dL   AST 34 15 - 41 U/L   ALT 20 0 - 44 U/L   Alkaline Phosphatase 59 38 - 126 U/L   Total Bilirubin 1.3 (H) 0.3 - 1.2 mg/dL   GFR, Estimated >62>60 >13>60 mL/min    Comment: (NOTE) Calculated using the CKD-EPI Creatinine Equation (2021)    Anion gap 15 5 - 15    Comment: Performed at [facility], [address] [place], [place] [address]  Ethanol     Status: Abnormal   Collection Time: 06/11/20 10:48 PM  Result Value Ref Range   Alcohol, Ethyl (B) 247 (H) <10 mg/dL    Comment: (NOTE) Lowest detectable limit for serum alcohol is 10 mg/dL.  For medical purposes only. Performed at [facility], [address] [place], [place] [address]   Salicylate level     Status: Abnormal   Collection Time: 06/11/20 10:48 PM  Result Value Ref Range   Salicylate Lvl <7.0 (L) 7.0 - 30.0 mg/dL    Comment: Performed at [facility], [address] [place], [place] [address]  Acetaminophen level     Status: Abnormal   Collection Time: 06/11/20 10:48 PM  Result Value Ref Range   Acetaminophen (Tylenol), Serum <10 (L) 10 - 30 ug/mL    Comment: (NOTE) Therapeutic concentrations vary significantly. A range of 10-30 ug/mL  may be an effective concentration for many patients. However, some  are best treated at concentrations outside of this range. Acetaminophen concentrations >150 ug/mL at 4 hours after ingestion  and >50 ug/mL at 12 hours  after ingestion are often associated with  toxic reactions.  Performed at [facility], [address] [place], [place] [address]   cbc     Status: Abnormal   Collection Time: 06/11/20 10:48 PM  Result Value Ref Range   WBC 8.4 4.0 - 10.5 K/uL   RBC 4.77 4.22 - 5.81 MIL/uL   Hemoglobin 16.8 13.0 - 17.0 g/dL   HCT 40.146.1 02.739.0 - 25.352.0 %   MCV 96.6 80.0 - 100.0 fL   MCH 35.2 (H) 26.0 - 34.0 pg   MCHC 36.4 (H) 30.0 - 36.0 g/dL   RDW 66.412.2 40.311.5 - 47.415.5 %   Platelets 334 150 - 400 K/uL   nRBC 0.0 0.0 - 0.2 %    Comment: Performed at [facility], [address] [place], [place] [address]  Resp Panel by RT-PCR (Flu A&B, Covid) Nasopharyngeal Swab     Status: None   Collection Time: 06/12/20  1:55 AM   Specimen: Nasopharyngeal Swab; Nasopharyngeal(NP) swabs in vial transport medium  Result Value Ref Range   SARS Coronavirus 2 by RT PCR NEGATIVE NEGATIVE    Comment: (NOTE) SARS-CoV-2 target nucleic acids are NOT DETECTED.  The SARS-CoV-2 RNA is generally detectable in upper respiratory specimens during the acute phase of infection. The lowest concentration of SARS-CoV-2 viral copies this assay can detect is 138 copies/mL. A negative result does not preclude SARS-Cov-2 infection and should not be used as the sole basis for treatment or other patient management decisions. A negative result may occur with  improper specimen collection/handling, submission of specimen other than nasopharyngeal swab, presence of viral mutation(s) within the areas targeted by this assay, and inadequate number of viral copies(<138 copies/mL). A negative result must  be combined with clinical observations, patient history, and epidemiological information. The expected result is Negative.  Fact Sheet for Patients:  BloggerCourse.com  Fact Sheet for Healthcare Providers:  SeriousBroker.it  This test is no t yet approved or cleared by the [place] FDA and  has been authorized for detection and/or diagnosis of SARS-CoV-2 by FDA under an Emergency Use Authorization (EUA). This EUA will remain  in effect (meaning this test can be used) for the duration of the COVID-19 declaration under Section 564(b)(1) of the Act, 21 U.S.C.section 360bbb-3(b)(1), unless the authorization is terminated  or revoked sooner.       Influenza A by PCR NEGATIVE NEGATIVE   Influenza B by PCR NEGATIVE NEGATIVE    Comment: (NOTE) The Xpert Xpress SARS-CoV-2/FLU/RSV plus assay is intended as an aid in the diagnosis of influenza from Nasopharyngeal swab specimens and should not be used as a sole basis for treatment. Nasal washings and aspirates are unacceptable for Xpert Xpress SARS-CoV-2/FLU/RSV testing.  Fact Sheet for Patients: BloggerCourse.com  Fact Sheet for Healthcare Providers: SeriousBroker.it  This test is not yet approved or cleared by the [place] FDA and has been authorized for detection and/or diagnosis of SARS-CoV-2 by FDA under an Emergency Use Authorization (EUA). This EUA will remain in effect (meaning this test can be used) for the duration of the COVID-19 declaration under Section 564(b)(1) of the Act, 21 U.S.C. section 360bbb-3(b)(1), unless the authorization is terminated or revoked.  Performed at [facility], [address] [place], [place] [address]   Rapid urine drug screen (hospital performed)     Status: Abnormal   Collection Time: 06/12/20  2:44 PM  Result Value Ref Range   Opiates NONE DETECTED NONE DETECTED   Cocaine NONE DETECTED NONE DETECTED   Benzodiazepines NONE DETECTED NONE DETECTED   Amphetamines NONE DETECTED NONE DETECTED   Tetrahydrocannabinol POSITIVE (A) NONE DETECTED   Barbiturates NONE DETECTED NONE DETECTED    Comment: (NOTE) DRUG SCREEN FOR MEDICAL PURPOSES ONLY.  IF CONFIRMATION IS NEEDED FOR ANY PURPOSE, NOTIFY LAB WITHIN 5  DAYS.  LOWEST DETECTABLE LIMITS FOR URINE DRUG SCREEN Drug Class                     Cutoff (ng/mL) Amphetamine and metabolites    1000 Barbiturate and metabolites    200 Benzodiazepine                 200 Tricyclics and metabolites     300 Opiates and metabolites        300 Cocaine and metabolites        300 THC                            50 Performed at [facility], [address], [place], [place] [address]     Medications:  Current Facility-Administered Medications  Medication Dose Route Frequency Provider Last Rate Last Admin  . hydrOXYzine (ATARAX/VISTARIL) tablet 25 mg  25 mg Oral Q6H PRN Aldean Baker, NP      . loperamide (IMODIUM) capsule 2-4 mg  2-4 mg Oral PRN Aldean Baker, NP      . LORazepam (ATIVAN) tablet 1 mg  1 mg Oral Q6H PRN Aldean Baker, NP       Or  . LORazepam (ATIVAN) injection 1 mg  1 mg Intramuscular Q6H PRN Aldean Baker, NP      .  multivitamin with minerals tablet 1 tablet  1 tablet Oral Daily Aldean Baker, NP      . ziprasidone (GEODON) injection 20 mg  20 mg Intramuscular Daily PRN Aldean Baker, NP       And  . OLANZapine zydis (ZYPREXA) disintegrating tablet 10 mg  10 mg Oral Q8H PRN Aldean Baker, NP      . ondansetron (ZOFRAN-ODT) disintegrating tablet 4 mg  4 mg Oral Q6H PRN Aldean Baker, NP      . Melene Muller ON 06/13/2020] thiamine tablet 100 mg  100 mg Oral Daily Aldean Baker, NP       No current outpatient medications on file.    Psychiatric Specialty Exam: Physical Exam  Review of Systems  Blood pressure (!) 140/103, pulse 70, temperature 98.3 F (36.8 C), temperature source Oral, resp. rate 16, SpO2 98 %.There is no height or weight on file to calculate BMI.  General Appearance: Casual  Eye Contact:  Fair  Speech:  Normal Rate  Volume:  Normal  Mood:  Irritable  Affect:  Congruent  Thought Process:  Coherent  Orientation:  Full (Time, Place, and Person)  Thought Content:  Logical  Suicidal Thoughts:  Denies   Homicidal Thoughts:  Denies  Memory:  Immediate;   Fair Recent;   Fair Remote;   Fair  Judgement:  Poor  Insight:  Lacking  Psychomotor Activity:  Normal  Concentration:  Concentration: Fair and Attention Span: Fair  Recall:  [employer] of Knowledge:  Fair  Language:  Fair  Akathisia:  No  Handed:  Right  AIMS (if indicated):     Assets:  Communication Skills Housing Social Support  ADL's:  Intact  Cognition:  WNL  Sleep:       Disposition: Continue to recommend inpatient treatment. PRN medications ordered for withdrawal from ETOH and agitation. ED staff updated. CSW updated for bed placement.  This service was provided via telemedicine using a 2-way, interactive audio and video technology with the identified patient and this Clinical research associate.  Aldean Baker, NP 06/12/2020 7:06 PM

## 2020-06-12 NOTE — ED Notes (Signed)
Patient finish TTS.

## 2020-06-12 NOTE — ED Notes (Signed)
Pt is having TTS with [facility] counselor.

## 2020-06-12 NOTE — BH Assessment (Signed)
AC [name], RN, reviewed pt's chart and information and at [phone] determined there is currently no appropriate bed available for pt at [facility]. Pt's referral information will be faxed to multiple hospitals for potential placement.

## 2020-06-12 NOTE — ED Notes (Signed)
This RN and the off duty officer informed the pt that he is being recommended for impatient placement.

## 2020-06-12 NOTE — ED Notes (Signed)
Pt made 1 phone call.

## 2020-06-12 NOTE — ED Notes (Signed)
Pt use phone 1 time at 1340

## 2020-06-12 NOTE — ED Notes (Signed)
Notified EDP about concerns with this pt.
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
## 2020-06-12 NOTE — ED Notes (Signed)
Pt out of restraints at this time. Pt sleeping and in NAD. Resp even and unlabored. Sitter at bedside.

## 2020-06-12 NOTE — ED Notes (Signed)
Patient cussing out [employer] and being verbally aggressive. Patient not redirectable
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
## 2020-06-12 NOTE — ED Notes (Signed)
Pt became verbally lod and requesting to leave. Educated patient on IVC status. Pt offered snacks but patient refuses. Pt was able to be de escalated. Pt laying in bed and in NAD. Personal belongings placed in locker 3.

## 2020-06-12 NOTE — ED Notes (Signed)
Contacted BH to inquire about TTS as the off duty officer felt talking with someone would help deescalate the pt.

## 2020-06-13 NOTE — ED Notes (Signed)
PT stated he was going to cause a scene if he had to take medication.  Notified that if he really wanted to complete treatment, a big part of that was cooperating with care and that the ativan was to prevent him from going through alcohol withdrawal.  Pt took ativan.

## 2020-06-13 NOTE — Progress Notes (Signed)
Pt meets inpatient criteria per Marciano Sequin, NP. Referral information has been sent to the following hospitals for review:  [facility]  [facility]  [facility]  [facility]  [facility]  [facility]  [facility]     Disposition will continue to follow for inpatient placement needs.    Wells Guiles, MSW, LCSW, LCAS Clinical Social Worker II Disposition CSW [phone]
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
## 2020-06-13 NOTE — ED Notes (Signed)
Placement Breakfast order placed 

## 2020-06-13 NOTE — ED Notes (Signed)
Called pt's employment, per pt's request, and notified them that pt could not make it to work.

## 2020-06-14 NOTE — Consult Note (Signed)
Telepsych Consultation   Location of Patient: [facility]  Patient Identification: Todd BourbonBrian Matthews MRN:  [id number] Principal Diagnosis: Alcohol abuse Diagnosis:  Principal Problem:   Alcohol abuse Active Problems:   Substance induced mood disorder (HCC)   Total Time spent with patient: 15 minutes  HPI:  Reassessment: Patient seen via telepsych. Chart reviewed. Todd Matthews is a 35 year old male with no reported psychiatric history who presented 06/11/20 under IVC from his mother for SI and HI toward family members. Admission BAL 247.  Patient seen sitting calmly in bed. He is irritable about still being in the hospital and is requesting discharge. Per nursing report patient has been irritable and uncooperative due to anger about being in the hospital, and threatening to cause problems if he is held in the hospital longer. He is irritable but not showing any signs of withdrawal from alcohol. He continues to strongly deny any SI. He denies any HI/AVH. He shows no signs of responding to internal stimuli. No paranoid or delusional thought content expressed. He is requesting discharge home so that he can return to his job today. I reinforced with patient my recommendation for alcohol use treatment.  Per TTS assessment 06/12/20: Todd Matthews is a 35 year old patient who [facility] by the [employer] under an IVC order. The IVC paperwork states:  "Respondent told his parents today that he wanted to kill himself and stated he wanted to kill them also. Stated to family members he called the suicide hotline twice today. Has a specific plan to commit suicide. Abuses alcohol daily. Is a danger to himself and others."  When asked why pt was at the hospital tonight, pt stated, "Because my family is a piece of shit. I wanted some type of therapeutic help." When asked what type of help pt was hoping to obtain he stated he did not know.  Pt acknowledges he experiences  SI daily, though he states he does not currently have a plan. He denies he's ever attempted to kill himself or that he's ever been hospitalized for mental health concerns. Pt denies HI, AVH, NSSIB, or access to guns/weapons. Pt acknowledges he has court on July 22, 2020 for a DWI. He states he engages in the use of EtOH on a daily basis, though he was not able to give an estimate of how much he drinks. Pt states he last drank today (06/11/2020). At 2341 his BAL was 247.  Pt's protective factors include no AVH and his family's concern for his well-being.  Pt gave verbal consent to contact his mother, Todd Matthews, who also filed the IVC ppwk on pt. She states pt is, "to the point where I think he's having a nervous breakdown. He is an alcoholic." She states pt has been making threats to kill himself and kill others. She state that life in general has been stressful for pt and that it's been occurring for "well over a year." She reports pt has made comments about wishing he was dead or that he could kill himself, stating, "I wish I could get ahold of a gun." Pt's mother states she is "most definitely" concerned for pt's safety and the safety of others.  Pt's orientation was [facility]. Pt's memory was [facility]. Pt was distracted by being handcuffed and wanting to lie down and go to sleep but, overall, was cooperative throughout the assessment process. Pt's insight, judgement, and impulse control is poor at this time.  Disposition: Patient admitted for aggressive behaviors while  drinking. He is sober at this time, angry about being in the hospital but strongly denies any SI/HI/AVH. He has been held for three days and no longer meets IVC criteria. He is psych cleared for discharge. I have requested CSW to place substance use treatment referrals in discharge paperwork. ED staff updated.  Past Psychiatric History: See above  Risk to Self:   Risk to Others:   Prior Inpatient Therapy:   Prior Outpatient Therapy:     Past Medical History:  Past Medical History:  Diagnosis Date  . Alcohol use   . Genital warts   . Seasonal allergies     Past Surgical History:  Procedure Laterality Date  . NO PAST SURGERIES     Family History:  Family History  Problem Relation Age of Onset  . Hypertension Father        Living  . Healthy Mother        living  . Stroke Paternal Grandmother   . Hypertension Paternal Grandmother   . Hyperlipidemia Other        Paternal & Maternal Uncles  . Anxiety disorder Sister   . Healthy Sister        #2  . Healthy Son        x1  . Healthy Daughter        x1   Family Psychiatric  History: Unknown Social History:  Social History   Substance and Sexual Activity  Alcohol Use Yes  . Alcohol/week: 0.0 standard drinks   Comment: liquor -- pint of liquor/day x 2 years     Social History   Substance and Sexual Activity  Drug Use Yes  . Types: Marijuana    Social History   Socioeconomic History  . Marital status: Single    Spouse name: Not on file  . Number of children: Not on file  . Years of education: Not on file  . Highest education level: Not on file  Occupational History  . Not on file  Tobacco Use  . Smoking status: Never Smoker  . Smokeless tobacco: Never Used  Substance and Sexual Activity  . Alcohol use: Yes    Alcohol/week: 0.0 standard drinks    Comment: liquor -- pint of liquor/day x 2 years  . Drug use: Yes    Types: Marijuana  . Sexual activity: Yes    Partners: Female    Birth control/protection: None  Other Topics Concern  . Not on file  Social History Narrative  . Not on file   Social Determinants of Health   Financial Resource Strain: Not on file  Food Insecurity: Not on file  Transportation Needs: Not on file  Physical Activity: Not on file  Stress: Not on file  Social Connections: Not on file   Additional Social History:    Allergies:  No Known Allergies  Labs:  Results for orders placed or performed during the  hospital encounter of 06/11/20 (from the past 48 hour(s))  Rapid urine drug screen (hospital performed)     Status: Abnormal   Collection Time: 06/12/20  2:44 PM  Result Value Ref Range   Opiates NONE DETECTED NONE DETECTED   Cocaine NONE DETECTED NONE DETECTED   Benzodiazepines NONE DETECTED NONE DETECTED   Amphetamines NONE DETECTED NONE DETECTED   Tetrahydrocannabinol POSITIVE (A) NONE DETECTED   Barbiturates NONE DETECTED NONE DETECTED    Comment: (NOTE) DRUG SCREEN FOR MEDICAL PURPOSES ONLY.  IF CONFIRMATION IS NEEDED FOR ANY PURPOSE, NOTIFY LAB WITHIN 5 DAYS.  LOWEST DETECTABLE LIMITS FOR URINE DRUG SCREEN Drug Class                     Cutoff (ng/mL) Amphetamine and metabolites    1000 Barbiturate and metabolites    200 Benzodiazepine                 200 Tricyclics and metabolites     300 Opiates and metabolites        300 Cocaine and metabolites        300 THC                            50 Performed at [facility], [address] [place], [place] [address]     Medications:  Current Facility-Administered Medications  Medication Dose Route Frequency Provider Last Rate Last Admin  . hydrOXYzine (ATARAX/VISTARIL) tablet 25 mg  25 mg Oral Q6H PRN Aldean Baker, NP   25 mg at 06/12/20 2120  . loperamide (IMODIUM) capsule 2-4 mg  2-4 mg Oral PRN Aldean Baker, NP      . LORazepam (ATIVAN) tablet 1 mg  1 mg Oral Q6H PRN Aldean Baker, NP   1 mg at 06/13/20 1445   Or  . LORazepam (ATIVAN) injection 1 mg  1 mg Intramuscular Q6H PRN Aldean Baker, NP      . multivitamin with minerals tablet 1 tablet  1 tablet Oral Daily Aldean Baker, NP   1 tablet at 06/13/20 1018  . ziprasidone (GEODON) injection 20 mg  20 mg Intramuscular Daily PRN Aldean Baker, NP       And  . OLANZapine zydis (ZYPREXA) disintegrating tablet 10 mg  10 mg Oral Q8H PRN Aldean Baker, NP      . ondansetron (ZOFRAN-ODT) disintegrating tablet 4 mg  4 mg Oral Q6H PRN Aldean Baker, NP      .  thiamine tablet 100 mg  100 mg Oral Daily Aldean Baker, NP   100 mg at 06/13/20 1018   No current outpatient medications on file.    Psychiatric Specialty Exam: Physical Exam  Review of Systems  Blood pressure 132/81, pulse 61, temperature 97.8 F (36.6 C), temperature source Oral, resp. rate 17, SpO2 97 %.There is no height or weight on file to calculate BMI.  General Appearance: Casual  Eye Contact:  Good  Speech:  Normal Rate  Volume:  Normal  Mood:  Irritable  Affect:  Congruent  Thought Process:  Coherent and Goal Directed  Orientation:  Full (Time, Place, and Person)  Thought Content:  Logical  Suicidal Thoughts:  No  Homicidal Thoughts:  No  Memory:  Immediate;   Good Recent;   Good Remote;   Good  Judgement:  Poor  Insight:  Lacking  Psychomotor Activity:  Normal  Concentration:  Concentration: Fair and Attention Span: Fair  Recall:  [employer] of Knowledge:  Fair  Language:  Fair  Akathisia:  No  Handed:  Right  AIMS (if indicated):     Assets:  Communication Skills Desire for Improvement Housing Social Support Vocational/Educational  ADL's:  Intact  Cognition:  WNL  Sleep:       Disposition: Patient admitted for aggressive behaviors while drinking. He is sober at this time, angry about being in the hospital but strongly denies any SI/HI/AVH. He has been held for three days and no longer meets IVC  criteria. He is psych cleared for discharge. I have requested CSW to place substance use treatment referrals in discharge paperwork. ED staff updated.  This service was provided via telemedicine using a 2-way, interactive audio and video technology with the identified patient and this Clinical research associate.  Aldean Baker, NP 06/14/2020 1:32 PM

## 2020-06-14 NOTE — Progress Notes (Signed)
Called and spoke with mother/IVC petitioner Espiridion Supinski [phone] to inform patient no longer meeting IVC criteria and being discharged today with referrals for alcohol use treatment.

## 2020-06-14 NOTE — ED Notes (Signed)
Pt threw part of his lunch against the door in his room.

## 2020-06-14 NOTE — ED Notes (Signed)
Pt agitated pacing in room, coming out of room yelling- "This is bullshit." Security came and is sitting outside of room. Pt took his shirt off and is staring out window. Pt told [employer] he was going to "piss and shit all over the room."

## 2020-06-14 NOTE — ED Provider Notes (Addendum)
Emergency Medicine Observation Re-evaluation Note  Todd Matthews is a 35 y.o. male, seen on rounds today.  Pt initially presented to the ED for complaints of IVC/Suicidal (Handcuffed w/ [employer]) Currently, the patient is resting.  Physical Exam  BP 132/81 (BP Location: Right Arm)   Pulse 61   Temp 97.8 F (36.6 C) (Oral)   Resp 17   SpO2 97%  Physical Exam General: Calm, resting Cardiac: Regular rate Lungs: Breathing easily no stridor   ED Course / MDM  EKG:EKG Interpretation  Date/Time:  Tuesday June 12 2020 16:58:30 EST Ventricular Rate:  67 PR Interval:    QRS Duration: 109 QT Interval:  418 QTC Calculation: 442 R Axis:   35 Text Interpretation: Sinus rhythm Ventricular premature complex Confirmed by Cherlynn Perches ([id number]) on 06/13/2020 9:32:50 AM  Clinical Course as of 06/14/20 1021  Tue Jun 12, 2020  0115 Patient sedated. VSS on the monitor. Handcuffs removed and he is placed in soft restraints. TTS involved in patient care.  [SU]  0240 Patient continues to sleep. VSS stable.   [SU]  0429 VSS on monitor. Patient still sleeping, NAD. Waiting for inpatient bed placement.   [SU]  [id number] Patient continues sleeping, changes position occasionally. VSS. Patient care signed out to oncoming provider team - awaiting placement.  [SU]    Clinical Course User Index [SU] Elpidio Anis, PA-C   I have reviewed the labs performed to date as well as medications administered while in observation.  Recent changes in the last 24 hours include patient had episodes of agitation and disruptive behavior.  Patient threw milk on the floor and would not clean it up.  Patient spit his food on the floor as well.  Plan  Current plan is for inpatient psychiatric treatment.    Linwood Dibbles, MD 06/14/20 1022 Pt was cleared by psychiatry.  Stable for discharge.   Linwood Dibbles, MD 06/14/20 1434

## 2020-06-14 NOTE — ED Notes (Signed)
Pt refusing vitals, states he will gladly go to jail over being here. Pt states he will "cause havoc for every day I'm here. Y'all want me here, I'm going to cause problems." This RN tried to explain to pt that we are here to help him, and his behavior, unfortunately, is what is keeping him here. Pt continues to be unreasonable and not allow vitals to be taken.

## 2020-06-14 NOTE — ED Notes (Signed)
Pt threw milk on the floor- [employer] informed pt he will need to clean it up. This RN provided towels for pt to clean. Pt refuses.

## 2020-06-14 NOTE — ED Notes (Signed)
Pt chewed up pancakes and spit all over the floor and threw syrup on the floor.

## 2020-06-14 NOTE — ED Notes (Signed)
Dr. Lynelle Doctor signed paperwork to rescind IVC papers. Paperwork has been faxed

## 2020-06-14 NOTE — ED Notes (Signed)
Gave pt all his belongings. Pt unwilling to sign paperwork for them. Pt states he will not harm himself or anyone else. Gave pt discharge paperwork as well.

## 2020-06-14 NOTE — ED Notes (Signed)
Pt again refuses to have any discharge vital signs obtained.

## 2020-06-18 ENCOUNTER — Telehealth (HOSPITAL_COMMUNITY): Payer: Self-pay | Admitting: General Practice

## 2020-06-18 NOTE — Telephone Encounter (Signed)
Care Management - Follow Up [facility] Discharges   Writer attempted to make contact with patient today and was unsuccessful.  Writer was able to leave a HIPPA compliant voice message and will await callback.   

## 2024-02-01 ENCOUNTER — Emergency Department (HOSPITAL_COMMUNITY): Payer: Self-pay

## 2024-02-01 ENCOUNTER — Inpatient Hospital Stay (HOSPITAL_COMMUNITY): Payer: Self-pay

## 2024-02-01 ENCOUNTER — Encounter (HOSPITAL_COMMUNITY)
Admission: EM | Disposition: A | Discharge status: Home / Self Care | Payer: Self-pay | Source: Home / Self Care | Attending: Cardiovascular Disease

## 2024-02-01 ENCOUNTER — Other Ambulatory Visit: Payer: Self-pay

## 2024-02-01 ENCOUNTER — Inpatient Hospital Stay (HOSPITAL_COMMUNITY)
Admission: EM | Admit: 2024-02-01 | Discharge: 2024-02-03 | DRG: 281 | Disposition: A | Discharge status: Home / Self Care | Payer: Self-pay | Attending: Cardiovascular Disease | Admitting: Cardiovascular Disease

## 2024-02-01 ENCOUNTER — Encounter (HOSPITAL_COMMUNITY): Payer: Self-pay | Admitting: Emergency Medicine

## 2024-02-01 DIAGNOSIS — I2510 Atherosclerotic heart disease of native coronary artery without angina pectoris: Secondary | ICD-10-CM | POA: Diagnosis present

## 2024-02-01 DIAGNOSIS — Z7982 Long term (current) use of aspirin: Secondary | ICD-10-CM

## 2024-02-01 DIAGNOSIS — E785 Hyperlipidemia, unspecified: Secondary | ICD-10-CM | POA: Diagnosis present

## 2024-02-01 DIAGNOSIS — R079 Chest pain, unspecified: Secondary | ICD-10-CM

## 2024-02-01 DIAGNOSIS — I214 Non-ST elevation (NSTEMI) myocardial infarction: Principal | ICD-10-CM | POA: Diagnosis present

## 2024-02-01 DIAGNOSIS — Z7902 Long term (current) use of antithrombotics/antiplatelets: Secondary | ICD-10-CM

## 2024-02-01 DIAGNOSIS — Z79899 Other long term (current) drug therapy: Secondary | ICD-10-CM

## 2024-02-01 DIAGNOSIS — E876 Hypokalemia: Secondary | ICD-10-CM | POA: Diagnosis present

## 2024-02-01 DIAGNOSIS — F1290 Cannabis use, unspecified, uncomplicated: Secondary | ICD-10-CM | POA: Diagnosis present

## 2024-02-01 DIAGNOSIS — R7989 Other specified abnormal findings of blood chemistry: Secondary | ICD-10-CM

## 2024-02-01 DIAGNOSIS — Z8249 Family history of ischemic heart disease and other diseases of the circulatory system: Secondary | ICD-10-CM

## 2024-02-01 DIAGNOSIS — F1020 Alcohol dependence, uncomplicated: Secondary | ICD-10-CM | POA: Diagnosis present

## 2024-02-01 DIAGNOSIS — I472 Ventricular tachycardia, unspecified: Secondary | ICD-10-CM | POA: Diagnosis present

## 2024-02-01 DIAGNOSIS — I249 Acute ischemic heart disease, unspecified: Principal | ICD-10-CM

## 2024-02-01 DIAGNOSIS — Z818 Family history of other mental and behavioral disorders: Secondary | ICD-10-CM

## 2024-02-01 DIAGNOSIS — I10 Essential (primary) hypertension: Secondary | ICD-10-CM | POA: Diagnosis present

## 2024-02-01 DIAGNOSIS — I7121 Aneurysm of the ascending aorta, without rupture: Secondary | ICD-10-CM | POA: Diagnosis present

## 2024-02-01 DIAGNOSIS — I252 Old myocardial infarction: Secondary | ICD-10-CM

## 2024-02-01 DIAGNOSIS — Z811 Family history of alcohol abuse and dependence: Secondary | ICD-10-CM

## 2024-02-01 DIAGNOSIS — Z823 Family history of stroke: Secondary | ICD-10-CM

## 2024-02-01 HISTORY — PX: LEFT HEART CATH AND CORONARY ANGIOGRAPHY: CATH118249

## 2024-02-01 LAB — ECHOCARDIOGRAM COMPLETE
AR max vel: 4.76 cm2
AV Area VTI: 5.12 cm2
AV Area mean vel: 4.88 cm2
AV Mean grad: 1 mmHg
AV Peak grad: 3.5 mmHg
Ao pk vel: 0.94 m/s
Area-P 1/2: 2.82 cm2
Calc EF: 59.1 %
Height: 72 in
MV VTI: 3.51 cm2
S' Lateral: 3.6 cm
Single Plane A2C EF: 52.4 %
Single Plane A4C EF: 62.6 %
Weight: 2702.4 [oz_av]

## 2024-02-01 LAB — CBC
HCT: 40.1 % (ref 39.0–52.0)
Hemoglobin: 13.8 g/dL (ref 13.0–17.0)
MCH: 35 pg — ABNORMAL HIGH (ref 26.0–34.0)
MCHC: 34.4 g/dL (ref 30.0–36.0)
MCV: 101.8 fL — ABNORMAL HIGH (ref 80.0–100.0)
Platelets: 256 K/uL (ref 150–400)
RBC: 3.94 MIL/uL — ABNORMAL LOW (ref 4.22–5.81)
RDW: 13.4 % (ref 11.5–15.5)
WBC: 10.2 K/uL (ref 4.0–10.5)
nRBC: 0 % (ref 0.0–0.2)

## 2024-02-01 LAB — URINE DRUG SCREEN
Amphetamines: NEGATIVE
Barbiturates: NEGATIVE
Benzodiazepines: NEGATIVE
Cocaine: NEGATIVE
Fentanyl: NEGATIVE
Methadone Scn, Ur: NEGATIVE
Opiates: NEGATIVE
Tetrahydrocannabinol: POSITIVE — AB

## 2024-02-01 LAB — HEPATIC FUNCTION PANEL
ALT: 23 U/L (ref 0–44)
AST: 56 U/L — ABNORMAL HIGH (ref 15–41)
Albumin: 4 g/dL (ref 3.5–5.0)
Alkaline Phosphatase: 64 U/L (ref 38–126)
Bilirubin, Direct: 0.1 mg/dL (ref 0.0–0.2)
Indirect Bilirubin: 0.2 mg/dL — ABNORMAL LOW (ref 0.3–0.9)
Total Bilirubin: 0.4 mg/dL (ref 0.0–1.2)
Total Protein: 6.1 g/dL — ABNORMAL LOW (ref 6.5–8.1)

## 2024-02-01 LAB — TROPONIN T, HIGH SENSITIVITY
Troponin T High Sensitivity: 111 ng/L (ref 0–19)
Troponin T High Sensitivity: 202 ng/L (ref 0–19)
Troponin T High Sensitivity: 367 ng/L (ref 0–19)
Troponin T High Sensitivity: 425 ng/L (ref 0–19)

## 2024-02-01 LAB — C-REACTIVE PROTEIN: CRP: <0.5 mg/dL (ref <1.0)

## 2024-02-01 LAB — HIV ANTIBODY (ROUTINE TESTING W REFLEX): HIV Screen 4th Generation wRfx: NONREACTIVE

## 2024-02-01 LAB — TSH: TSH: 1.03 u[IU]/mL (ref 0.350–4.500)

## 2024-02-01 LAB — MAGNESIUM: Magnesium: 1.8 mg/dL (ref 1.7–2.4)

## 2024-02-01 LAB — SEDIMENTATION RATE: Sed Rate: 2 mm/h (ref 0–16)

## 2024-02-01 LAB — BASIC METABOLIC PANEL WITH GFR
Anion gap: 13 (ref 5–15)
BUN: 7 mg/dL (ref 6–20)
CO2: 26 mmol/L (ref 22–32)
Calcium: 9.8 mg/dL (ref 8.9–10.3)
Chloride: 100 mmol/L (ref 98–111)
Creatinine, Ser: 0.9 mg/dL (ref 0.61–1.24)
GFR, Estimated: >60 mL/min (ref >60)
Glucose, Bld: 115 mg/dL — ABNORMAL HIGH (ref 70–99)
Potassium: 3.4 mmol/L — ABNORMAL LOW (ref 3.5–5.1)
Sodium: 139 mmol/L (ref 135–145)

## 2024-02-01 LAB — HEPARIN LEVEL (UNFRACTIONATED): Heparin Unfractionated: 0.25 [IU]/mL — ABNORMAL LOW (ref 0.30–0.70)

## 2024-02-01 MED ORDER — VERAPAMIL HCL 2.5 MG/ML IV SOLN
INTRAVENOUS | Status: AC
  Filled 2024-02-01: qty 2

## 2024-02-01 MED ORDER — MAGNESIUM SULFATE 2 GM/50ML IV SOLN
2.0000 g | Freq: Once | INTRAVENOUS | Status: AC
  Administered 2024-02-01: 2 g via INTRAVENOUS
  Filled 2024-02-01: qty 50

## 2024-02-01 MED ORDER — ADULT MULTIVITAMIN W/MINERALS CH
1.0000 | ORAL_TABLET | Freq: Every day | ORAL | Status: DC
  Administered 2024-02-01 – 2024-02-03 (×3): 1 via ORAL
  Filled 2024-02-01 (×3): qty 1

## 2024-02-01 MED ORDER — POTASSIUM CHLORIDE CRYS ER 20 MEQ PO TBCR
20.0000 meq | EXTENDED_RELEASE_TABLET | Freq: Once | ORAL | Status: AC
  Administered 2024-02-01: 20 meq via ORAL
  Filled 2024-02-01: qty 1

## 2024-02-01 MED ORDER — FENTANYL CITRATE (PF) 100 MCG/2ML IJ SOLN
INTRAMUSCULAR | Status: DC | PRN
  Administered 2024-02-01: 25 ug via INTRAVENOUS

## 2024-02-01 MED ORDER — ONDANSETRON HCL 4 MG/2ML IJ SOLN: 4.0000 mg | Freq: Four times a day (QID) | INTRAMUSCULAR | Status: DC | PRN

## 2024-02-01 MED ORDER — MIDAZOLAM HCL 2 MG/2ML IJ SOLN
INTRAMUSCULAR | Status: AC
  Filled 2024-02-01: qty 2

## 2024-02-01 MED ORDER — IOHEXOL 350 MG/ML SOLN
100.0000 mL | Freq: Once | INTRAVENOUS | Status: AC | PRN
  Administered 2024-02-01: 100 mL via INTRAVENOUS

## 2024-02-01 MED ORDER — SODIUM CHLORIDE 0.9 % IV SOLN: 250.0000 mL | INTRAVENOUS | Status: AC | PRN

## 2024-02-01 MED ORDER — HEPARIN (PORCINE) 25000 UT/250ML-% IV SOLN
950.0000 [IU]/h | INTRAVENOUS | Status: DC
  Administered 2024-02-01: 950 [IU]/h via INTRAVENOUS
  Filled 2024-02-01: qty 250

## 2024-02-01 MED ORDER — SODIUM CHLORIDE 0.9% FLUSH: 3.0000 mL | INTRAVENOUS | Status: DC | PRN

## 2024-02-01 MED ORDER — LIDOCAINE HCL (PF) 1 % IJ SOLN
INTRAMUSCULAR | Status: DC | PRN
  Administered 2024-02-01: 2 mL

## 2024-02-01 MED ORDER — NITROGLYCERIN 0.4 MG SL SUBL: 0.4000 mg | SUBLINGUAL_TABLET | SUBLINGUAL | Status: DC | PRN

## 2024-02-01 MED ORDER — HEPARIN SODIUM (PORCINE) 1000 UNIT/ML IJ SOLN
INTRAMUSCULAR | Status: AC
  Filled 2024-02-01: qty 10

## 2024-02-01 MED ORDER — CARVEDILOL 6.25 MG PO TABS
6.2500 mg | ORAL_TABLET | Freq: Two times a day (BID) | ORAL | Status: DC
Start: 2024-02-01 — End: 2024-02-02
  Administered 2024-02-01 – 2024-02-02 (×2): 6.25 mg via ORAL
  Filled 2024-02-01 (×2): qty 1

## 2024-02-01 MED ORDER — THIAMINE MONONITRATE 100 MG PO TABS
100.0000 mg | ORAL_TABLET | Freq: Every day | ORAL | Status: DC
  Administered 2024-02-01 – 2024-02-03 (×3): 100 mg via ORAL
  Filled 2024-02-01 (×3): qty 1

## 2024-02-01 MED ORDER — LIDOCAINE HCL (PF) 1 % IJ SOLN
INTRAMUSCULAR | Status: AC
  Filled 2024-02-01: qty 30

## 2024-02-01 MED ORDER — FREE WATER
500.0000 mL | Freq: Once | Status: AC
  Administered 2024-02-01: 500 mL via ORAL

## 2024-02-01 MED ORDER — ASPIRIN 81 MG PO TBEC
81.0000 mg | DELAYED_RELEASE_TABLET | Freq: Every day | ORAL | Status: DC
  Administered 2024-02-02 – 2024-02-03 (×2): 81 mg via ORAL
  Filled 2024-02-01 (×2): qty 1

## 2024-02-01 MED ORDER — LACTATED RINGERS IV BOLUS
1000.0000 mL | Freq: Once | INTRAVENOUS | Status: AC
  Administered 2024-02-01: 1000 mL via INTRAVENOUS

## 2024-02-01 MED ORDER — ASPIRIN 81 MG PO CHEW
324.0000 mg | CHEWABLE_TABLET | Freq: Once | ORAL | Status: AC
  Administered 2024-02-01: 324 mg via ORAL
  Filled 2024-02-01: qty 4

## 2024-02-01 MED ORDER — HEPARIN (PORCINE) IN NACL 1000-0.9 UT/500ML-% IV SOLN
INTRAVENOUS | Status: DC | PRN
  Administered 2024-02-01: 500 mL

## 2024-02-01 MED ORDER — HEPARIN SODIUM (PORCINE) 1000 UNIT/ML IJ SOLN
INTRAMUSCULAR | Status: DC | PRN
  Administered 2024-02-01: 4000 [IU] via INTRAVENOUS

## 2024-02-01 MED ORDER — LORAZEPAM 1 MG PO TABS: 0.0000 mg | ORAL_TABLET | Freq: Two times a day (BID) | ORAL | Status: DC

## 2024-02-01 MED ORDER — FOLIC ACID 1 MG PO TABS
1.0000 mg | ORAL_TABLET | Freq: Every day | ORAL | Status: DC
  Administered 2024-02-01 – 2024-02-03 (×3): 1 mg via ORAL
  Filled 2024-02-01 (×3): qty 1

## 2024-02-01 MED ORDER — MIDAZOLAM HCL 2 MG/2ML IJ SOLN
INTRAMUSCULAR | Status: DC | PRN
  Administered 2024-02-01: 2 mg via INTRAVENOUS

## 2024-02-01 MED ORDER — SODIUM CHLORIDE 0.9% FLUSH
3.0000 mL | Freq: Two times a day (BID) | INTRAVENOUS | Status: DC
  Administered 2024-02-01: 3 mL via INTRAVENOUS

## 2024-02-01 MED ORDER — LORAZEPAM 1 MG PO TABS
1.0000 mg | ORAL_TABLET | ORAL | Status: DC | PRN
  Filled 2024-02-01: qty 1

## 2024-02-01 MED ORDER — ACETAMINOPHEN 325 MG PO TABS
650.0000 mg | ORAL_TABLET | ORAL | Status: DC | PRN
  Administered 2024-02-01 (×2): 650 mg via ORAL
  Filled 2024-02-01 (×2): qty 2

## 2024-02-01 MED ORDER — ALUM & MAG HYDROXIDE-SIMETH 200-200-20 MG/5ML PO SUSP
30.0000 mL | Freq: Once | ORAL | Status: AC
Start: 2024-02-01 — End: 2024-02-01
  Administered 2024-02-01: 30 mL via ORAL
  Filled 2024-02-01: qty 30

## 2024-02-01 MED ORDER — LORAZEPAM 2 MG/ML IJ SOLN: 1.0000 mg | INTRAMUSCULAR | Status: DC | PRN

## 2024-02-01 MED ORDER — HEPARIN BOLUS VIA INFUSION
4000.0000 [IU] | Freq: Once | INTRAVENOUS | Status: AC
  Administered 2024-02-01: 4000 [IU] via INTRAVENOUS
  Filled 2024-02-01: qty 4000

## 2024-02-01 MED ORDER — POTASSIUM CHLORIDE CRYS ER 20 MEQ PO TBCR
40.0000 meq | EXTENDED_RELEASE_TABLET | Freq: Once | ORAL | Status: AC
  Administered 2024-02-01: 40 meq via ORAL
  Filled 2024-02-01: qty 2

## 2024-02-01 MED ORDER — ATORVASTATIN CALCIUM 40 MG PO TABS
40.0000 mg | ORAL_TABLET | Freq: Every day | ORAL | Status: DC
  Administered 2024-02-01 – 2024-02-03 (×3): 40 mg via ORAL
  Filled 2024-02-01 (×3): qty 1

## 2024-02-01 MED ORDER — SODIUM CHLORIDE 0.9 % IV SOLN: INTRAVENOUS | Status: AC

## 2024-02-01 MED ORDER — THIAMINE HCL 100 MG/ML IJ SOLN: 100.0000 mg | Freq: Every day | INTRAMUSCULAR | Status: DC

## 2024-02-01 MED ORDER — FENTANYL CITRATE (PF) 100 MCG/2ML IJ SOLN
INTRAMUSCULAR | Status: AC
  Filled 2024-02-01: qty 2

## 2024-02-01 MED ORDER — SODIUM CHLORIDE (PF) 0.9 % IJ SOLN
INTRAMUSCULAR | Status: AC
  Filled 2024-02-01: qty 50

## 2024-02-01 MED ORDER — LORAZEPAM 1 MG PO TABS
0.0000 mg | ORAL_TABLET | Freq: Four times a day (QID) | ORAL | Status: AC
  Administered 2024-02-01 – 2024-02-02 (×2): 1 mg via ORAL
  Filled 2024-02-01 (×2): qty 1
  Filled 2024-02-01: qty 2

## 2024-02-01 MED ORDER — KETOROLAC TROMETHAMINE 15 MG/ML IJ SOLN
15.0000 mg | Freq: Once | INTRAMUSCULAR | Status: AC
  Administered 2024-02-01: 15 mg via INTRAVENOUS
  Filled 2024-02-01: qty 1

## 2024-02-01 MED ORDER — NITROGLYCERIN IN D5W 200-5 MCG/ML-% IV SOLN
5.0000 ug/min | INTRAVENOUS | Status: DC
  Administered 2024-02-01: 5 ug/min via INTRAVENOUS
  Filled 2024-02-01: qty 250

## 2024-02-01 MED ORDER — VERAPAMIL HCL 2.5 MG/ML IV SOLN
INTRAVENOUS | Status: DC | PRN
  Administered 2024-02-01: 10 mL via INTRA_ARTERIAL

## 2024-02-01 MED ORDER — NITROGLYCERIN 0.4 MG SL SUBL
0.4000 mg | SUBLINGUAL_TABLET | SUBLINGUAL | Status: DC | PRN
  Administered 2024-02-01 (×3): 0.4 mg via SUBLINGUAL
  Filled 2024-02-01 (×2): qty 1

## 2024-02-01 NOTE — ED Notes (Signed)
 Pt alert, NAD, calm, interactive, resps e/u, speaking in clear complete sentences. VSS, BP elevated. Mag level added. Speaking with cards provider at [facility].

## 2024-02-01 NOTE — ED Notes (Signed)
 No scheduled time for CL yet, pending.

## 2024-02-01 NOTE — H&P (Addendum)
 Cardiology Admission History and Physical   Patient ID: Todd Matthews MRN: [id number]; DOB: 1985/11/06   Admission date: 02/01/2024  PCP:  Patient, No Pcp Per   [facility] Providers Cardiologist:  New to Dr. Francyne Finn here to update MD or APP on Care Team, Refresh:1}    Chief Complaint:  chest pain  Patient Profile: Todd Matthews is a 38 y.o. male with tobacco use, ETOH abuse, THC use who is being seen 02/01/2024 for the evaluation of chest pain/elevated troponin.  History of Present Illness:  [name] has no prior cardiac history. He recalls having some sort of stress test at another hospital for a job around 2021 but does not recall the results. He has worked as a Location manager. He typically is very athletic but has not been specifically physically active recently. He currently smokes and drinks slightly less than a fifth of liquor a day. There is a family history of alcoholism. He works as a Location manager. His maternal grandfather died in a cab on the way to the hospital of a heart attack in his 14s. He does not go to PCP regularly but has been told in the past during incidental PRN visits that BP was high. He does not take any regular medicines.   On Saturday around 2pm he noticed about 10 minutes of chest pressure that he had never felt before. It subsided spontaneously the recurred yesterday around the same time, again briefly x 10 minutes, before resolving without intervention. Around 10pm it began to recur and significantly worsen, 8/10, and he was unable to sleep due to feeling so uncomfortable. He tried laying down in many different positions but nothing helped. It seemed a little worse laying on his belly. He does notice some improvement sitting up and leaning forward but doesn't complete yresolved. This was associated with SOB and nausea but no vomiting. No worsening with inspiration. Due to persistent symptoms, he came to the ED and received 324mg  ASA,  Maalox, Toradol, 2 SL NTG, 1L IVF with resolution of symptoms. Initial troponin was elevated to 111 so started on IV heparin, next value 202. K 3.4 so received 40meq KCl x1. He had a 27 beat run of NSVT associated with chest pain and diaphoresis. The pain began to return this morning rated 3-4/10 prompting an additional SL NTG with gradual improvement, easing off. No recent viral symptoms, fever, chills, syncope, palpitations, vaccinations, edema, exercise intolerance, surgery, travel, bedrest.  CXR NAD, normal mediastinal contours.   Initial EKG showed SB 54bpm, baseline artifact, nonspecific minor ST upsloping inferiorly, TW flattening I, avL, V5-V6. F/u EKG 10:16AM shows NSR 62bpm with slight pronunciation of ST elevation inferiorly and V5-V6 with associated TWI. Repeat EKG during worsening CP appeared similar to 10:16am tracing.   Past Medical History:  Diagnosis Date   Alcohol use    Genital warts    Seasonal allergies    Past Surgical History:  Procedure Laterality Date   NO PAST SURGERIES       Medications Prior to Admission: Prior to Admission medications   Not on File     Allergies:   No Known Allergies  Social History:   Social History   Socioeconomic History   Marital status: Single    Spouse name: Not on file   Number of children: Not on file   Years of education: Not on file   Highest education level: Not on file  Occupational History   Not on file  Tobacco Use  Smoking status: Never   Smokeless tobacco: Never  Substance and Sexual Activity   Alcohol use: Yes    Alcohol/week: 0.0 standard drinks of alcohol    Comment: liquor -- pint of liquor/day x 2 years   Drug use: Yes    Types: Marijuana   Sexual activity: Yes    Partners: Female    Birth control/protection: None  Other Topics Concern   Not on file  Social History Narrative   Not on file   Social Drivers of Health   Financial Resource Strain: Not on file  Food Insecurity: Not on file   Transportation Needs: Not on file  Physical Activity: Not on file  Stress: Not on file  Social Connections: Not on file  Intimate Partner Violence: Not on file     Family History:   The patient's family history includes Anxiety disorder in his sister; Healthy in his daughter, mother, sister, and son; Hyperlipidemia in an other family member; Hypertension in his father and paternal grandmother; Stroke in his paternal grandmother.    ROS:  Please see the history of present illness.  All other ROS reviewed and negative.     Physical Exam/Data: Vitals:   02/01/24 1045 02/01/24 1100 02/01/24 1115 02/01/24 1130  BP: (!) 135/104 (!) 147/105 (!) 149/109 (!) 142/103  Pulse: 61 60 (!) 58 62  Resp: (!) 24 12 12 12   Temp:      TempSrc:      SpO2: 99% 99% 100% 100%  Weight:      Height:        Intake/Output Summary (Last 24 hours) at 02/01/2024 1201 Last data filed at 02/01/2024 0929 Gross per 24 hour  Intake 1000 ml  Output --  Net 1000 ml      02/01/2024    8:58 AM 11/20/2015    7:14 AM 10/16/2015    1:26 PM  Last 3 Weights  Weight (lbs) 168 lb 14.4 oz 164 lb 6 oz 163 lb  Weight (kg) 76.613 kg 74.56 kg 73.936 kg     Body mass index is 22.91 kg/m.  General: Well developed, well nourished, in no acute distress. Head: Normocephalic, atraumatic, sclera non-icteric, no xanthomas, nares are without discharge. Mild exopthalmos Neck: Negative for carotid bruits. JVP not elevated. Lungs: Clear bilaterally to auscultation without wheezes, rales, or rhonchi. Breathing is unlabored. Heart: RRR S1 S2 without murmurs, rubs. + S4.  Abdomen: Soft, non-tender, non-distended with normoactive bowel sounds. No rebound/guarding. Extremities: No clubbing or cyanosis. No edema. Distal pedal pulses are 2+ and equal bilaterally. Neuro: Alert and oriented X 3. Moves all extremities spontaneously. Psych:  Responds to questions appropriately with a normal affect.   EKG:   The EKG was personally  reviewed and demonstrates:   SB 54bpm, baseline artifact, nonspecific minor ST upsloping inferiorly, TW flattening I, avL, V5-V6. F/u EKG 10:16AM shows NSR 62bpm with slight pronunciation of ST elevation inferiorly and V5-V6 with associated TWI. Repeat EKG during worsening CP appeared similar to 10:16am tracing.  Relevant CV Studies: None  Laboratory Data: High Sensitivity Troponin:  No results for input(s): TROPONINIHS in the last 720 hours.    Chemistry Recent Labs  Lab 02/01/24 0638 02/01/24 0851  NA 139  --   K 3.4*  --   CL 100  --   CO2 26  --   GLUCOSE 115*  --   BUN 7  --   CREATININE 0.90  --   CALCIUM 9.8  --   MG  --  1.8  GFRNONAA >60  --   ANIONGAP 13  --     Recent Labs  Lab 02/01/24 0851  PROT 6.1*  ALBUMIN 4.0  AST 56*  ALT 23  ALKPHOS 64  BILITOT 0.4   Lipids No results for input(s): CHOL, TRIG, HDL, LABVLDL, LDLCALC, CHOLHDL in the last 168 hours. Hematology Recent Labs  Lab 02/01/24 0638  WBC 10.2  RBC 3.94*  HGB 13.8  HCT 40.1  MCV 101.8*  MCH 35.0*  MCHC 34.4  RDW 13.4  PLT 256   Thyroid No results for input(s): TSH, FREET4 in the last 168 hours. BNPNo results for input(s): BNP, PROBNP in the last 168 hours.  DDimer No results for input(s): DDIMER in the last 168 hours.  Radiology/Studies:  DG Chest 2 View Result Date: 02/01/2024 EXAM: 2 VIEW(S) XRAY OF THE CHEST 02/01/2024 06:57:00 AM COMPARISON: None available. CLINICAL HISTORY: Chest pain. Chest pain. Chest pain. Chest pain. FINDINGS: LUNGS AND PLEURA: No focal pulmonary opacity. No pulmonary edema. No pleural effusion. No pneumothorax. HEART AND MEDIASTINUM: No acute abnormality of the cardiac and mediastinal silhouettes. BONES AND SOFT TISSUES: No acute osseous abnormality. IMPRESSION: 1. No acute process. Electronically signed by: Waddell Calk MD 02/01/2024 07:02 AM EDT RP Workstation: [id number]     Assessment and Plan:  1. Chest pain with elevated  troponin concerning for NSTEMI vs myopericarditis - EKG shows progression of subtle ST upsloping inferiorly and V5-V6 with associated TWI - d/w Dr. Francyne, does not meet STEMI criteria but needs LHC today -> notified cardmaster - plan to admit to cardiology at [facility] - continue to cycle enzymes - check CRP, ESR - check echocardiogram - continue heparin per pharmacy pending cath outcome - continue ASA 81mg  daily - hold off BB with HR 50s-60 in ED - add atorvastatin 40mg  daily for now - place on IV NTG drip for now - check lipids, LP(a), A1c in AM - note CXR with normal mediastinal contours   Informed Consent Shared Decision Making/Informed Consent The risks [stroke (1 in 1000), death (1 in 1000), kidney failure [usually temporary] (1 in 500), bleeding (1 in 200), allergic reaction [possibly serious] (1 in 200)], benefits (diagnostic support and management of coronary artery disease) and alternatives of a cardiac catheterization were discussed in detail with Mr. Marsala and he is willing to proceed.     2. NSVT with hypokalemia - K 3.4 -> given 40meq KCl, will give another 20meq at 1400 - Mg 1.8 -> will give 2g mag sulfate - hold off BB with HR 50s-60s - cath/echo as above   3. Suspected HTN - start IV NTG drip, further med rx TBD based on outcome of testing - check TSH with mild exopthalmos   4. ETOH, THC, tobacco use - SW for cessation resources - CIWA protocol - will need PCP f/u for ETOH-related monitoring      Risk Assessment/Risk Scores:   TIMI Risk Score for Unstable Angina or Non-ST Elevation MI:   The patient's TIMI risk score is 2, which indicates a 8% risk of all cause mortality, new or recurrent myocardial infarction or need for urgent revascularization in the next 14 days.   Code Status: Full Code  Severity of Illness: The appropriate patient status for this patient is INPATIENT. Inpatient status is judged to be reasonable and necessary in order to  provide the required intensity of service to ensure the patient's safety. The patient's presenting symptoms, physical exam findings, and initial radiographic and laboratory data  in the context of their chronic comorbidities is felt to place them at high risk for further clinical deterioration. Furthermore, it is not anticipated that the patient will be medically stable for discharge from the hospital within 2 midnights of admission.   * I certify that at the point of admission it is my clinical judgment that the patient will require inpatient hospital care spanning beyond 2 midnights from the point of admission due to high intensity of service, high risk for further deterioration and high frequency of surveillance required.*  For questions or updates, please contact [facility] Please consult [email or web address] for contact info under       Signed, Dayna N Dunn, PA-C  02/01/2024 12:01 PM   I have seen and examined the patient along with Dayna N Dunn, PA-C .  I have reviewed the chart, notes and new data.  I agree with PA/NP's note.  Key new complaints: Continues to have pressure-like chest discomfort.  This improved after nitroglycerin, but this happened more than 10 minutes after receiving nitroglycerin so not sure it means that this is angina.  The discomfort is positional, improved by leaning forward. Key examination changes: Appears lean and fit, no jugular venous distention, no edema, clear lungs, regular rate and rhythm, normal S1 and S2, distinct fourth heart sound is present, no pericardial rubs or murmurs are heard. Key new findings / data: Mildly elevated high-sensitivity troponin 101--202--367.  ECG shows sinus bradycardia with very subtle less than 0.5 mm ST segment elevation in the inferior leads with subtle T wave flattening/inversion across multiple leads.  On telemetry he had a roughly 16 beat episode of monomorphic wide-complex tachycardia, relatively slow at 140 bpm (reperfusion  VT?).  No polymorphic VT seen.  PVCs are relatively infrequent.  Chest x-ray shows clear lungs, normal cardiac silhouette size and no evidence of widening of the mediastinum, although on the lateral view the left atrium appears dilated.  PLAN: Somewhat mixed picture.  Cannot entirely exclude acute coronary insufficiency, however I favor a diagnosis of acute myopericarditis. Regardless, with his ongoing chest pain complaints, worsening biomarkers, S4 gallop and notable ventricular arrhythmia, I think the next best step is coronary angiography while we also check a stat echocardiogram and inflammatory markers.  Reasonable to continue intravenous heparin until we clarify the etiology of his complaints. At risk for EtOH withdrawal due to high daily consumption of alcohol. For now we will treat his hypertension with intravenous nitroglycerin, adjust choices of antihypertensives depending on the findings on cardiac catheterization and echo.  This procedure has been fully reviewed with the patient and his mother written informed consent has been obtained.   Jerel Balding, MD, FACC [facility] [phone] 02/01/2024, 1:20 PM

## 2024-02-01 NOTE — Progress Notes (Signed)
 PHARMACY - ANTICOAGULATION CONSULT NOTE  Pharmacy Consult for heparin Indication: chest pain/ACS  No Known Allergies  Patient Measurements: Height: 6' (182.9 cm) Weight: 76.6 kg (168 lb 14.4 oz) IBW/kg (Calculated) : 77.6 HEPARIN DW (KG): 76.6  Vital Signs: Temp: 97.7 F (36.5 C) (10/06 1345) Temp Source: Oral (10/06 1345) BP: 141/101 (10/06 1915) Pulse Rate: 57 (10/06 2000)  Labs: Recent Labs    02/01/24 0638 02/01/24 1525  HGB 13.8  --   HCT 40.1  --   PLT 256  --   HEPARINUNFRC  --  0.25*  CREATININE 0.90  --     Estimated Creatinine Clearance: 122.9 mL/min (by C-G formula based on SCr of 0.9 mg/dL).   Medical History: Past Medical History:  Diagnosis Date   Alcohol use    Genital warts    Seasonal allergies     Medications:  No medications prior to admission.   Scheduled:   [START ON 02/02/2024] aspirin EC  81 mg Oral Daily   atorvastatin  40 mg Oral Daily   folic acid  1 mg Oral Daily   LORazepam   0-4 mg Oral Q6H   Followed by   NOREEN ON 02/03/2024] LORazepam   0-4 mg Oral Q12H   multivitamin with minerals  1 tablet Oral Daily   sodium chloride flush  3 mL Intravenous Q12H   thiamine   100 mg Oral Daily   Or   thiamine   100 mg Intravenous Daily    Assessment: 38 YO male presenting with chest pain and left arm numbness, troponin 111 on admission, repeat pending. No anticoagulation noted PTA. Pharmacy consulted for heparin dosing in setting of ACS/STEMI.   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  Heparin to start 2h post TR band.   Of note, 6h HL 0.25 on 950 units/hr.  Received 4000 units in cath @1845 .   Goal of Therapy:  Heparin level 0.3-0.7 units/ml Monitor platelets by anticoagulation protocol: Yes   Plan:  RESTART heparin IV at 1050 units/hr, 2h after TR band is removed Check heparin level 6hrs after infusion starts Monitor  heparin level, CBC, and s/sx of bleeding daily  Maurilio Fila, PharmD Clinical Pharmacist 02/01/2024  9:35 PM

## 2024-02-01 NOTE — ED Notes (Signed)
 Cards MD in to see

## 2024-02-01 NOTE — ED Notes (Signed)
 Per PA, performed another EKG on pt

## 2024-02-01 NOTE — ED Provider Notes (Signed)
 [place] EMERGENCY DEPARTMENT AT [facility] Provider Note   CSN: [id number] Arrival date & time: 02/01/24  9371     Patient presents with: Chest Pain   Todd Matthews is a 38 y.o. male.   38 year old male with no significant past medical history who presents today for substernal chest pain that started last night.  Denies any inciting event.  Denies any previous history of the same.  He states that it has been constant since.  He is unsure of any alleviating or aggravating factors outside of [place] which he states made it worse.  He denies any significant family history of cardiac disease.  Denies any recent long travel, recent surgery, history of blood clots.  Does not take any daily medications.  He does admit to smoking tobacco and drinking lots of alcohol.  He states his left arm distal to the elbow feels somewhat numb.  No weakness.  Denies diaphoresis, shortness of breath, palpitations.  The history is provided by the patient. No language interpreter was used.       Prior to Admission medications   Not on File    Allergies: Patient has no known allergies.    Review of Systems  Constitutional:  Negative for chills and fever.  Respiratory:  Negative for shortness of breath.   Cardiovascular:  Positive for chest pain.  Neurological:  Negative for light-headedness.  All other systems reviewed and are negative.   Updated Vital Signs BP 131/89   Pulse (!) 57   Temp 97.9 F (36.6 C)   Resp 19   SpO2 100%   Physical Exam Vitals and nursing note reviewed.  Constitutional:      General: He is not in acute distress.    Appearance: Normal appearance. He is not ill-appearing.  HENT:     Head: Normocephalic and atraumatic.     Nose: Nose normal.  Eyes:     Conjunctiva/sclera: Conjunctivae normal.  Cardiovascular:     Rate and Rhythm: Regular rhythm. Bradycardia present.  Pulmonary:     Effort: Pulmonary effort is normal. No respiratory distress.     Breath  sounds: Normal breath sounds. No wheezing.  Musculoskeletal:        General: No deformity. Normal range of motion.     Cervical back: Normal range of motion.     Right lower leg: No edema.     Left lower leg: No edema.  Skin:    Findings: No rash.  Neurological:     Mental Status: He is alert.     (all labs ordered are listed, but only abnormal results are displayed) Labs Reviewed  BASIC METABOLIC PANEL WITH GFR  CBC  TROPONIN T, HIGH SENSITIVITY    EKG: EKG Interpretation Date/Time:  Monday February 01 2024 06:39:04 EDT Ventricular Rate:  54 PR Interval:  57 QRS Duration:  99 QT Interval:  445 QTC Calculation: 422 R Axis:   84  Text Interpretation: Sinus rhythm Short PR interval Consider right atrial enlargement Nonspecific T abnormalities, lateral leads No significant change was found Confirmed by Carita Senior [id number]) on 02/01/2024 6:47:54 AM  Radiology: [facility] Chest 2 View Result Date: 02/01/2024 EXAM: 2 VIEW(S) XRAY OF THE CHEST 02/01/2024 06:57:00 AM COMPARISON: None available. CLINICAL HISTORY: Chest pain. Chest pain. Chest pain. Chest pain. FINDINGS: LUNGS AND PLEURA: No focal pulmonary opacity. No pulmonary edema. No pleural effusion. No pneumothorax. HEART AND MEDIASTINUM: No acute abnormality of the cardiac and mediastinal silhouettes. BONES AND SOFT TISSUES: No acute osseous  abnormality. IMPRESSION: 1. No acute process. Electronically signed by: Waddell Calk MD 02/01/2024 07:02 AM EDT RP Workstation: [id number]     .Critical Care  Performed by: Hildegard Loge, PA-C Authorized by: Hildegard Loge, PA-C   Critical care provider statement:    Critical care time (minutes):  30   Critical care was necessary to treat or prevent imminent or life-threatening deterioration of the following conditions: acs, heparin drip.   Critical care was time spent personally by me on the following activities:  Development of treatment plan with patient or surrogate, discussions with  consultants, evaluation of patient's response to treatment, examination of patient, ordering and review of laboratory studies, ordering and review of radiographic studies, ordering and performing treatments and interventions, pulse oximetry, re-evaluation of patient's condition and review of old charts    Medications Ordered in the ED  ketorolac (TORADOL) 15 MG/ML injection 15 mg (has no administration in time range)  alum & mag hydroxide-simeth (MAALOX/MYLANTA) 200-200-20 MG/5ML suspension 30 mL (has no administration in time range)  lactated ringers bolus 1,000 mL (has no administration in time range)                                    Medical Decision Making Amount and/or Complexity of Data Reviewed Labs: ordered. Radiology: ordered.  Risk OTC drugs. Prescription drug management. Decision regarding hospitalization.   Medical Decision Making / ED Course   This patient presents to the ED for concern of chest pain, this involves an extensive number of treatment options, and is a complaint that carries with it a high risk of complications and morbidity.  The differential diagnosis includes ACS, PE, pneumonia, dissection Low suspicion for dissection given his history and physical exam  MDM: 38 year old male presents today for concern of chest pain that started last night.  He has had 2 fleeting episodes of chest pain in the past 2 days that felt the same.  No prior history of DVT or PE.  No significant family history of cardiac disease.  Not on any daily medications.  Pain improved after nitro.  CBC without leukocytosis or anemia.  BMP without acute concern.  Initial troponin of 111, repeat of 202.  [place] with cardiology.  They will admit patient.  Chest x-ray without acute cardiopulmonary process.  Aspirin given.  Nitro was given with some improvement in symptoms.  Heparin drip initiated.  Patient admitted to cardiology service.    Additional history obtained: -Additional  history obtained from mom who later came to bedside -External records from outside source obtained and reviewed including: Chart review including previous notes, labs, imaging, consultation notes   Lab Tests: -I ordered, reviewed, and interpreted labs.   The pertinent results include:   Labs Reviewed  BASIC METABOLIC PANEL WITH GFR - Abnormal; Notable for the following components:      Result Value   Potassium 3.4 (*)    Glucose, Bld 115 (*)    All other components within normal limits  CBC - Abnormal; Notable for the following components:   RBC 3.94 (*)    MCV 101.8 (*)    MCH 35.0 (*)    All other components within normal limits  HEPATIC FUNCTION PANEL - Abnormal; Notable for the following components:   Total Protein 6.1 (*)    AST 56 (*)    Indirect Bilirubin 0.2 (*)    All other components within normal limits  URINE DRUG  SCREEN - Abnormal; Notable for the following components:   Tetrahydrocannabinol POSITIVE (*)    All other components within normal limits  TROPONIN T, HIGH SENSITIVITY - Abnormal; Notable for the following components:   Troponin T High Sensitivity 111 (*)    All other components within normal limits  TROPONIN T, HIGH SENSITIVITY - Abnormal; Notable for the following components:   Troponin T High Sensitivity 202 (*)    All other components within normal limits  TROPONIN T, HIGH SENSITIVITY - Abnormal; Notable for the following components:   Troponin T High Sensitivity 367 (*)    All other components within normal limits  MAGNESIUM  TSH  SEDIMENTATION RATE  HEPARIN LEVEL (UNFRACTIONATED)  C-REACTIVE PROTEIN  HIV ANTIBODY (ROUTINE TESTING W REFLEX)  TROPONIN T, HIGH SENSITIVITY      EKG  EKG Interpretation Date/Time:  Monday February 01 2024 06:39:04 EDT Ventricular Rate:  54 PR Interval:  57 QRS Duration:  99 QT Interval:  445 QTC Calculation: 422 R Axis:   84  Text Interpretation: Sinus rhythm Short PR interval Consider right atrial  enlargement Nonspecific T abnormalities, lateral leads No significant change was found Confirmed by Carita Senior [phone]) on 02/01/2024 6:47:54 AM         Imaging Studies ordered: I ordered imaging studies including chest x-ray I independently visualized and interpreted imaging. I agree with the radiologist interpretation   Medicines ordered and prescription drug management: Meds ordered this encounter  Medications   ketorolac (TORADOL) 15 MG/ML injection 15 mg   alum & mag hydroxide-simeth (MAALOX/MYLANTA) 200-200-20 MG/5ML suspension 30 mL   lactated ringers bolus 1,000 mL   aspirin chewable tablet 324 mg   DISCONTD: nitroGLYCERIN (NITROSTAT) SL tablet 0.4 mg   heparin bolus via infusion 4,000 Units   heparin ADULT infusion 100 units/mL (25000 units/250mL)   potassium chloride SA (KLOR-CON M) CR tablet 40 mEq   nitroGLYCERIN 50 mg in dextrose 5 % 250 mL (0.2 mg/mL) infusion   aspirin EC tablet 81 mg   magnesium sulfate IVPB 2 g 50 mL   atorvastatin (LIPITOR) tablet 40 mg   potassium chloride SA (KLOR-CON M) CR tablet 20 mEq   nitroGLYCERIN (NITROSTAT) SL tablet 0.4 mg   acetaminophen  (TYLENOL ) tablet 650 mg   ondansetron  (ZOFRAN ) injection 4 mg   sodium chloride flush (NS) 0.9 % injection 3 mL   sodium chloride flush (NS) 0.9 % injection 3 mL   0.9 %  sodium chloride infusion   free water  500 mL   OR Linked Order Group    LORazepam  (ATIVAN ) tablet 1-4 mg     CIWA-AR < 5 =:   0 mg     CIWA-AR 5 -10 =:   1 mg     CIWA-AR 11 -15 =:   2 mg     CIWA-AR 16 -20 =:   3 mg     CIWA-AR 16 -20 =:   Recheck CIWA-AR in 1 hour; if > 20 notify MD     CIWA-AR > 20 =:   4 mg     CIWA-AR > 20 =:   Call Rapid Response    LORazepam  (ATIVAN ) injection 1-4 mg     CIWA-AR < 5 =:   0 mg     CIWA-AR 5 -10 =:   1 mg     CIWA-AR 11 -15 =:   2 mg     CIWA-AR 16 -20 =:   3 mg     CIWA-AR 16 -20 =:  Recheck CIWA-AR in 1 hour; if > 20 notify MD     CIWA-AR > 20 =:   4 mg     CIWA-AR > 20 =:    Call Rapid Response   OR Linked Order Group    thiamine  (VITAMIN B1) tablet 100 mg    thiamine  (VITAMIN B1) injection 100 mg   folic acid (FOLVITE) tablet 1 mg   multivitamin with minerals tablet 1 tablet   FOLLOWED BY Linked Order Group    LORazepam  (ATIVAN ) tablet 0-4 mg     CIWA-AR < 5 =:   0 mg     CIWA-AR 5 -10 =:   1 mg     CIWA-AR 11 -15 =:   2 mg     CIWA-AR 16 -20 =:   3 mg     CIWA-AR 16 -20 =:   Recheck CIWA-AR in 1 hour; if > 20 notify MD     CIWA-AR > 20 =:   4 mg     CIWA-AR > 20 =:   Call Rapid Response    LORazepam  (ATIVAN ) tablet 0-4 mg     CIWA-AR < 5 =:   0 mg     CIWA-AR 5 -10 =:   1 mg     CIWA-AR 11 -15 =:   2 mg     CIWA-AR 16 -20 =:   3 mg     CIWA-AR 16 -20 =:   Recheck CIWA-AR in 1 hour; if > 20 notify MD     CIWA-AR > 20 =:   4 mg     CIWA-AR > 20 =:   Call Rapid Response   0.9 %  sodium chloride infusion   iohexol (OMNIPAQUE) 350 MG/ML injection 100 mL    -I have reviewed the patients home medicines and have made adjustments as needed  Critical interventions Heparin drip  Consultations Obtained: I requested consultation with the cardiology,  and discussed lab and imaging findings as well as pertinent plan - they recommend: As above   Cardiac Monitoring: The patient was maintained on a cardiac monitor.  I personally viewed and interpreted the cardiac monitored which showed an underlying rhythm of: Sinus bradycardia  Social Determinants of Health:  Factors impacting patients care include: Alcohol use   Reevaluation: After the interventions noted above, I reevaluated the patient and found that they have :improved  Co morbidities that complicate the patient evaluation  Past Medical History:  Diagnosis Date   Alcohol use    Genital warts    Seasonal allergies       Dispostion: Admitted to cardiology service for further eval.   Final diagnoses:  ACS (acute coronary syndrome) [facility])    ED Discharge Orders     None           Hildegard Loge, PA-C 02/01/24 1447    Suzette Pac, MD 02/01/24 1551

## 2024-02-01 NOTE — Progress Notes (Addendum)
 2D echo reviewed with Dr. Francyne - EF 50-55% + possible basal inferior/inferoseptal akinesis, mildly elevated PA pressure, dilated IVC, mild-moderate TR. Notably the aortic root is 47mm. We feel given presentation with CP, uncontrolled HTN, age, prudent to proceed with CTA to exclude aortic dissection prior to proceeding with cardiac cath. If CTA unremarkable, will still anticipate cath today. Start IVF NS @ 100cc/hr.  Addendum -CTA with:  1. 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 Aortic valve measures up to 3.5 cm. Tubular ascending thoracic aorta measures up to 3.1 x 3.1 cm. No evidence of dissection or other acute aortic pathology. 2. Normal contour and caliber of the abdominal aorta. No evidence of aneurysm, dissection, or other acute aortic pathology. 3. Coronary artery disease, advanced for patient age.  Proceed with cath as planned. No dissection. Asked nurse to keep patient informed of plan. Updated cath lab team as well.

## 2024-02-01 NOTE — Progress Notes (Signed)
 PHARMACY - ANTICOAGULATION CONSULT NOTE  Pharmacy Consult for heparin Indication: chest pain/ACS  No Known Allergies  Patient Measurements: Height: 6' (182.9 cm) Weight: 76.6 kg (168 lb 14.4 oz) IBW/kg (Calculated) : 77.6 HEPARIN DW (KG): 76.6  Vital Signs: Temp: 97.9 F (36.6 C) (10/06 0634) BP: 131/89 (10/06 0634) Pulse Rate: 57 (10/06 0634)  Labs: Recent Labs    02/01/24 0638  HGB 13.8  HCT 40.1  PLT 256  CREATININE 0.90    Estimated Creatinine Clearance: 122.9 mL/min (by C-G formula based on SCr of 0.9 mg/dL).   Medical History: Past Medical History:  Diagnosis Date   Alcohol use    Genital warts    Seasonal allergies     Medications:  (Not in a hospital admission)  Scheduled:   Assessment: 38 YO male presenting with chest pain and left arm numbness, troponin 111 on admission, repeat pending. No anticoagulation noted PTA. Pharmacy consulted for heparin dosing in setting of ACS/STEMI.   Today, 02/01/24: Troponin elevated at 111, repeat pending Hgb 13.8, plts 256--stable Scr 0.90--stable No s/sx of bleeding documented   Goal of Therapy:  Heparin level 0.3-0.7 units/ml Monitor platelets by anticoagulation protocol: Yes   Plan:  Give heparin 4000 units IV bolus x1 Start heparin infusion at 950 units/hr  Check heparin level 6hrs after infusion starts Monitor heparin level, CBC, and s/sx of bleeding daily   Lacinda Moats, PharmD Clinical Pharmacist  10/6/20259:03 AM

## 2024-02-01 NOTE — Interval H&P Note (Signed)
 History and Physical Interval Note:  02/01/2024 6:03 PM  Todd Matthews  has presented today for surgery, with the diagnosis of NSTEMi.  The various methods of treatment have been discussed with the patient and family. After consideration of risks, benefits and other options for treatment, the patient has consented to  Procedure(s): LEFT HEART CATH AND CORONARY ANGIOGRAPHY (N/A) as a surgical intervention.  The patient's history has been reviewed, patient examined, no change in status, stable for surgery.  I have reviewed the patient's chart and labs.  Questions were answered to the patient's satisfaction.     [name]

## 2024-02-01 NOTE — ED Notes (Signed)
 Mother back at [facility] speaking with cards PA. Ntg and KCL given. Repeat trop sent.

## 2024-02-01 NOTE — Progress Notes (Signed)
 PHARMACY - ANTICOAGULATION CONSULT NOTE  Pharmacy Consult for heparin Indication: chest pain/ACS  No Known Allergies  Patient Measurements: Height: 6' (182.9 cm) Weight: 76.6 kg (168 lb 14.4 oz) IBW/kg (Calculated) : 77.6 HEPARIN DW (KG): 76.6  Vital Signs: Temp: 97.7 F (36.5 C) (10/06 1345) Temp Source: Oral (10/06 1345) BP: 138/104 (10/06 1645) Pulse Rate: 66 (10/06 1645)  Labs: Recent Labs    02/01/24 0638 02/01/24 1525  HGB 13.8  --   HCT 40.1  --   PLT 256  --   HEPARINUNFRC  --  0.25*  CREATININE 0.90  --     Estimated Creatinine Clearance: 122.9 mL/min (by C-G formula based on SCr of 0.9 mg/dL).   Medical History: Past Medical History:  Diagnosis Date   Alcohol use    Genital warts    Seasonal allergies     Medications:  (Not in a hospital admission)  Scheduled:   [START ON 02/02/2024] aspirin EC  81 mg Oral Daily   atorvastatin  40 mg Oral Daily   folic acid  1 mg Oral Daily   LORazepam   0-4 mg Oral Q6H   Followed by   NOREEN ON 02/03/2024] LORazepam   0-4 mg Oral Q12H   multivitamin with minerals  1 tablet Oral Daily   sodium chloride flush  3 mL Intravenous Q12H   thiamine   100 mg Oral Daily   Or   thiamine   100 mg Intravenous Daily    Assessment: 38 YO male presenting with chest pain and left arm numbness, troponin 111 on admission, repeat pending. No anticoagulation noted PTA. Pharmacy consulted for heparin dosing in setting of ACS/STEMI.   Today, 02/01/24: First heparin level slightly low on 950 units/hr Hgb WNL SCr WNL (baseline < 1.0) No bleeding or infusion issues per RN  Goal of Therapy:  Heparin level 0.3-0.7 units/ml Monitor platelets by anticoagulation protocol: Yes   Plan:  Patient transferred to [facility] Lab before heparin rate able to be adjusted [facility] Pharmacist notified; will follow for further heparin needs   Bard Jeans, PharmD, BCPS [phone] 02/01/2024, 5:14 PM

## 2024-02-01 NOTE — ED Notes (Signed)
 Spoke with Todd Matthews in cath lab. Pt is on cath list. No specific time. They will call [facility] when they are ready for him. If he gets a bed at [facility] prior to cath call, we will initiate [facility] for transport. Pending bed assignment or cath lab call.

## 2024-02-01 NOTE — ED Notes (Signed)
 [facility] at [facility], preparing to transport to [facility]. No changes. VSS. NSR/SB.Denies pain.

## 2024-02-01 NOTE — ED Triage Notes (Signed)
 Patient c/o Chest pain started at 10 pm last night. Patient stated my left arms feels funny. Patient report nausea, denies vomiting. Patient denies SOB.

## 2024-02-01 NOTE — ED Notes (Signed)
 Echo finished at [facility]. Feeling better. VSS/ improved.

## 2024-02-01 NOTE — ED Notes (Signed)
 Back from CTA, alert, NAD, calm, interactive. VSS. Denies pain.

## 2024-02-01 NOTE — ED Notes (Signed)
Card MD at [facility]

## 2024-02-02 ENCOUNTER — Encounter (HOSPITAL_COMMUNITY): Payer: Self-pay | Admitting: Cardiovascular Disease

## 2024-02-02 DIAGNOSIS — I7121 Aneurysm of the ascending aorta, without rupture: Secondary | ICD-10-CM

## 2024-02-02 DIAGNOSIS — I214 Non-ST elevation (NSTEMI) myocardial infarction: Principal | ICD-10-CM

## 2024-02-02 DIAGNOSIS — F10139 Alcohol abuse with withdrawal, unspecified: Secondary | ICD-10-CM

## 2024-02-02 LAB — MAGNESIUM: Magnesium: 1.9 mg/dL (ref 1.7–2.4)

## 2024-02-02 LAB — BASIC METABOLIC PANEL WITH GFR
Anion gap: 7 (ref 5–15)
BUN: <5 mg/dL — ABNORMAL LOW (ref 6–20)
CO2: 24 mmol/L (ref 22–32)
Calcium: 8.4 mg/dL — ABNORMAL LOW (ref 8.9–10.3)
Chloride: 107 mmol/L (ref 98–111)
Creatinine, Ser: 0.93 mg/dL (ref 0.61–1.24)
GFR, Estimated: >60 mL/min (ref >60)
Glucose, Bld: 122 mg/dL — ABNORMAL HIGH (ref 70–99)
Potassium: 3.5 mmol/L (ref 3.5–5.1)
Sodium: 138 mmol/L (ref 135–145)

## 2024-02-02 LAB — LIPID PANEL
Cholesterol: 184 mg/dL (ref 0–200)
HDL: 58 mg/dL (ref >40)
LDL Cholesterol: 111 mg/dL — ABNORMAL HIGH (ref 0–99)
Total CHOL/HDL Ratio: 3.2 ratio
Triglycerides: 76 mg/dL (ref <150)
VLDL: 15 mg/dL (ref 0–40)

## 2024-02-02 LAB — CBC
HCT: 34.3 % — ABNORMAL LOW (ref 39.0–52.0)
Hemoglobin: 12 g/dL — ABNORMAL LOW (ref 13.0–17.0)
MCH: 36.7 pg — ABNORMAL HIGH (ref 26.0–34.0)
MCHC: 35 g/dL (ref 30.0–36.0)
MCV: 104.9 fL — ABNORMAL HIGH (ref 80.0–100.0)
Platelets: 172 K/uL (ref 150–400)
RBC: 3.27 MIL/uL — ABNORMAL LOW (ref 4.22–5.81)
RDW: 13.9 % (ref 11.5–15.5)
WBC: 7.3 K/uL (ref 4.0–10.5)
nRBC: 0 % (ref 0.0–0.2)

## 2024-02-02 LAB — HEMOGLOBIN A1C
Hgb A1c MFr Bld: 4.7 % — ABNORMAL LOW (ref 4.8–5.6)
Mean Plasma Glucose: 88.19 mg/dL

## 2024-02-02 LAB — HEPARIN LEVEL (UNFRACTIONATED)
Heparin Unfractionated: 0.1 [IU]/mL — ABNORMAL LOW (ref 0.30–0.70)
Heparin Unfractionated: 0.28 [IU]/mL — ABNORMAL LOW (ref 0.30–0.70)

## 2024-02-02 MED ORDER — HEPARIN (PORCINE) 25000 UT/250ML-% IV SOLN
1600.0000 [IU]/h | INTRAVENOUS | Status: DC
  Administered 2024-02-02: 1050 [IU]/h via INTRAVENOUS
  Filled 2024-02-02 (×2): qty 250

## 2024-02-02 MED ORDER — HEPARIN BOLUS VIA INFUSION
4000.0000 [IU] | Freq: Once | INTRAVENOUS | Status: AC
Start: 2024-02-02 — End: 2024-02-02
  Administered 2024-02-02: 4000 [IU] via INTRAVENOUS
  Filled 2024-02-02: qty 4000

## 2024-02-02 MED ORDER — POTASSIUM CHLORIDE CRYS ER 20 MEQ PO TBCR
40.0000 meq | EXTENDED_RELEASE_TABLET | ORAL | Status: AC
  Administered 2024-02-02: 40 meq via ORAL
  Filled 2024-02-02: qty 2

## 2024-02-02 MED ORDER — AMLODIPINE BESYLATE 5 MG PO TABS
5.0000 mg | ORAL_TABLET | Freq: Every day | ORAL | Status: DC
  Administered 2024-02-02 – 2024-02-03 (×2): 5 mg via ORAL
  Filled 2024-02-02 (×2): qty 1

## 2024-02-02 MED ORDER — CARVEDILOL 12.5 MG PO TABS
12.5000 mg | ORAL_TABLET | Freq: Two times a day (BID) | ORAL | Status: DC
  Administered 2024-02-02 – 2024-02-03 (×2): 12.5 mg via ORAL
  Filled 2024-02-02 (×2): qty 1

## 2024-02-02 MED ORDER — CLOPIDOGREL BISULFATE 75 MG PO TABS
75.0000 mg | ORAL_TABLET | Freq: Every day | ORAL | Status: DC
  Administered 2024-02-02 – 2024-02-03 (×2): 75 mg via ORAL
  Filled 2024-02-02 (×2): qty 1

## 2024-02-02 MED ORDER — MAGNESIUM SULFATE IN D5W 1-5 GM/100ML-% IV SOLN
1.0000 g | Freq: Once | INTRAVENOUS | Status: AC
  Administered 2024-02-02: 1 g via INTRAVENOUS
  Filled 2024-02-02: qty 100

## 2024-02-02 MED ORDER — CARVEDILOL 6.25 MG PO TABS
6.2500 mg | ORAL_TABLET | Freq: Once | ORAL | Status: AC
  Administered 2024-02-02: 6.25 mg via ORAL
  Filled 2024-02-02: qty 1

## 2024-02-02 MED ORDER — POTASSIUM CHLORIDE CRYS ER 20 MEQ PO TBCR
20.0000 meq | EXTENDED_RELEASE_TABLET | Freq: Once | ORAL | Status: AC
  Administered 2024-02-02: 20 meq via ORAL
  Filled 2024-02-02: qty 1

## 2024-02-02 MED ORDER — CARVEDILOL 12.5 MG PO TABS: 12.5000 mg | ORAL_TABLET | Freq: Two times a day (BID) | ORAL | Status: DC

## 2024-02-02 NOTE — Progress Notes (Signed)
 PHARMACY - ANTICOAGULATION CONSULT NOTE  Pharmacy Consult for heparin Indication: chest pain/ACS  No Known Allergies  Patient Measurements: Height: 6' (182.9 cm) Weight: 76.1 kg (167 lb 11.2 oz) IBW/kg (Calculated) : 77.6 HEPARIN DW (KG): 76.1  Vital Signs: Temp: 98.8 F (37.1 C) (10/07 1616) Temp Source: Oral (10/07 1616) BP: 129/88 (10/07 1856) Pulse Rate: 77 (10/07 1856)  Labs: Recent Labs    02/01/24 0638 02/01/24 1525 02/02/24 0247 02/02/24 1058 02/02/24 1928  HGB 13.8  --  12.0*  --   --   HCT 40.1  --  34.3*  --   --   PLT 256  --  172  --   --   HEPARINUNFRC  --  0.25*  --  0.10* 0.28*  CREATININE 0.90  --  0.93  --   --     Estimated Creatinine Clearance: 118.2 mL/min (by C-G formula based on SCr of 0.93 mg/dL).   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 No anticoagulation noted PTA. Pharmacy consulted for heparin dosing in setting of ACS/STEMI.    Heparin level 0.28/ is subtherapeutic on 1200 units/hr. Patient with ventricular tachycardia. One more day heparin per MD. Given plans to discontinue heparin on 10/8, will plan on recheck with AM labs.   Goal of Therapy:  Heparin level 0.3-0.7 units/ml Monitor platelets by anticoagulation protocol: Yes   Plan:  Heparin increase to 1300 units/hr  Monitor daily heparin level, CBC, signs/symptoms of bleeding  Stop heparin 10/8   Massie Fila, PharmD Clinical Pharmacist  02/02/2024 8:12 PM

## 2024-02-02 NOTE — Progress Notes (Signed)
 Mobility Specialist Progress Note:    02/02/24 1202  Mobility  Activity Ambulated independently  Level of Assistance Standby assist, set-up cues, supervision of patient - no hands on  Assistive Device None  Distance Ambulated (ft) 500 ft  Activity Response Tolerated well  Mobility Referral Yes  Mobility visit 1 Mobility  Mobility Specialist Start Time (ACUTE ONLY) 1202  Mobility Specialist Stop Time (ACUTE ONLY) 1211  Mobility Specialist Time Calculation (min) (ACUTE ONLY) 9 min   Pt pleasant and agreeable to session. No c/o any symptoms. Pt able to move and ambulate well. Pt was having runs of Vtach noted when discussing a difficult situation. Returned to normal after. RN noted. Returned pt to room w/ all needs met.   Venetia Keel Mobility Specialist Please Neurosurgeon or Rehab Office at [phone]

## 2024-02-02 NOTE — Progress Notes (Signed)
 CCMD contacted this RN and stated pt had more than 5 beats of sustained VT.

## 2024-02-02 NOTE — Progress Notes (Signed)
 PHARMACY - ANTICOAGULATION CONSULT NOTE  Pharmacy Consult for heparin Indication: chest pain/ACS  No Known Allergies  Patient Measurements: Height: 6' (182.9 cm) Weight: 76.1 kg (167 lb 11.2 oz) IBW/kg (Calculated) : 77.6 HEPARIN DW (KG): 76.1  Vital Signs: Temp: 98.1 F (36.7 C) (10/07 0740) Temp Source: Oral (10/07 0740) BP: 137/88 (10/07 0740) Pulse Rate: 69 (10/07 0740)  Labs: Recent Labs    02/01/24 0638 02/01/24 1525 02/02/24 0247  HGB 13.8  --  12.0*  HCT 40.1  --  34.3*  PLT 256  --  172  HEPARINUNFRC  --  0.25*  --   CREATININE 0.90  --  0.93    Estimated Creatinine Clearance: 118.2 mL/min (by C-G formula based on SCr of 0.93 mg/dL).   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 No anticoagulation noted PTA. Pharmacy consulted for heparin dosing in setting of ACS/STEMI.    Heparin level 0.1 is subtherapeutic on 1100 units/hr.  No issues with infusion or bleeding per RN. Heparin for 1 more day per MD. Patient with ventricular tachycardia. One more day heparin per MD.   Goal of Therapy:  Heparin level 0.3-0.7 units/ml Monitor platelets by anticoagulation protocol: Yes   Plan:  Heparin 4000 units x1 then increase to 1250 units/hr  F/u 6hr heparin level  Monitor daily heparin level, CBC, signs/symptoms of bleeding  Stop heparin 10/8   Jinnie Door, PharmD, BCPS, [facility] Clinical Pharmacist  Please check AMION for all [facility] phone numbers After 10:00 PM, call Main Pharmacy [phone]

## 2024-02-02 NOTE — Progress Notes (Addendum)
 Dr. Francyne was notified earlier of 43bt run VT, reviewed on telemetry, monomorphic, otherwise peripherally in NSR. He has increased patient's carvedilol. K 3.5, Mg 1.9. We will supplement potassium and magnesium for goal K 4.0 or greater and Mg 2.0 or greater. Recheck lytes in AM.

## 2024-02-02 NOTE — Plan of Care (Signed)
  Problem: Education: Goal: Understanding of cardiac disease, CV risk reduction, and recovery process will improve Outcome: Progressing   

## 2024-02-02 NOTE — Plan of Care (Signed)
  Problem: Education: Goal: Knowledge of General Education information will improve Description: Including pain rating scale, medication(s)/side effects and non-pharmacologic comfort measures Outcome: Progressing   Problem: Clinical Measurements: Goal: Diagnostic test results will improve Outcome: Progressing   Problem: Clinical Measurements: Goal: Cardiovascular complication will be avoided Outcome: Progressing   

## 2024-02-02 NOTE — Progress Notes (Signed)
  Progress Note  Patient Name: Todd Matthews Date of Encounter: 02/02/2024 [facility] Cardiologist: New  Interval Summary   No longer has any angina.  Has not had dyspnea.  No arrhythmia on monitor.  Blood pressure remains higher than target at 137/88, on low-dose IV nitroglycerin (15 mics) and oral carvedilol.  Vital Signs Vitals:   02/02/24 0428 02/02/24 0429 02/02/24 0635 02/02/24 0740  BP:  (!) 127/90 (!) 133/92 137/88  Pulse:  74 69 69  Resp:  18  18  Temp:  98.9 F (37.2 C)  98.1 F (36.7 C)  TempSrc:  Oral  Oral  SpO2: 99%   98%  Weight:      Height:        Intake/Output Summary (Last 24 hours) at 02/02/2024 0851 Last data filed at 02/02/2024 0743 Gross per 24 hour  Intake 2790 ml  Output 200 ml  Net 2590 ml      02/01/2024    9:22 PM 02/01/2024    8:58 AM 11/20/2015    7:14 AM  Last 3 Weights  Weight (lbs) 167 lb 11.2 oz 168 lb 14.4 oz 164 lb 6 oz  Weight (kg) 76.068 kg 76.613 kg 74.56 kg      Telemetry/ECG  Sinus rhythm- Personally Reviewed  Physical Exam  GEN: No acute distress.   Neck: No JVD Cardiac: RRR, no murmurs, rubs, or gallops.  Respiratory: Clear to auscultation bilaterally. GI: Soft, nontender, non-distended  MS: No edema  Assessment & Plan  CAD s/p NSTEMI: Currently asymptomatic, but still on IV nitrates.  Will wean off gradually today, while helping prevent angina with carvedilol and amlodipine.  Continue IV heparin another 24 hours.  Plan 12 months of dual antiplatelet therapy with aspirin and clopidogrel after which he will take aspirin monotherapy. Asc Ao aneurysm: Remarkably large for his age at 4.8 cm, but without dissection or aortic insufficiency.  No family history of aortic aneurysm.  Possibly related to uncontrolled blood pressure.  Plan to monitor with yearly CT angiography.  Target BP around 120s/70s.  Currently using beta-blockers and calcium channel blockers for antianginal benefit, but long-term would benefit from ARB as  well. HLP: LDL cholesterol modestly elevated at 111.  Target LDL less than 70.  On effective high dose atorvastatin.  Monitor liver function test periodically due to alcohol consumption. Alcohol abuse: Based on information from his family, he has chronic alcoholism.  However, he reports that he has not had severe withdrawal symptoms in the past (family does report that he becomes extremely anxious and irritable when he does not have his usual drink and that he will start drinking at 10:00 in the morning).  Last alcoholic beverage was roughly 36 hours ago.  He did receive 2 x 1 mg doses of lorazepam  overnight, after receiving a small dose of midazolam during his cardiac catheterization. Medical Readiness Date: 02/03/2024    For questions or updates, please contact  [facility] Please consult [email or web address] for contact info under         Signed, Jerel Balding, MD

## 2024-02-02 NOTE — Progress Notes (Signed)
 PHARMACY - ANTICOAGULATION CONSULT NOTE  Pharmacy Consult for heparin Indication: chest pain/ACS  No Known Allergies  Patient Measurements: Height: 6' (182.9 cm) Weight: 76.1 kg (167 lb 11.2 oz) IBW/kg (Calculated) : 77.6 HEPARIN DW (KG): 76.1  Vital Signs: Temp: 98.4 F (36.9 C) (10/07 0015) Temp Source: Oral (10/07 0015) BP: 136/89 (10/07 0015) Pulse Rate: 71 (10/07 0015)  Labs: Recent Labs    02/01/24 0638 02/01/24 1525  HGB 13.8  --   HCT 40.1  --   PLT 256  --   HEPARINUNFRC  --  0.25*  CREATININE 0.90  --     Estimated Creatinine Clearance: 122.1 mL/min (by C-G formula based on SCr of 0.9 mg/dL).   Medical History: Past Medical History:  Diagnosis Date   Alcohol use    Genital warts    Seasonal allergies     Medications:  No medications prior to admission.   Scheduled:   aspirin EC  81 mg Oral Daily   atorvastatin  40 mg Oral Daily   carvedilol  6.25 mg Oral BID WC   folic acid  1 mg Oral Daily   LORazepam   0-4 mg Oral Q6H   Followed by   NOREEN ON 02/03/2024] LORazepam   0-4 mg Oral Q12H   multivitamin with minerals  1 tablet Oral Daily   sodium chloride flush  3 mL Intravenous Q12H   sodium chloride flush  3 mL Intravenous Q12H   thiamine   100 mg Oral Daily   Or   thiamine   100 mg Intravenous Daily    Assessment: 38 YO male presenting with chest pain and left arm numbness, troponin 111 on admission, repeat pending. No anticoagulation noted PTA. Pharmacy consulted for heparin dosing in setting of ACS/STEMI.   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  Heparin to start 2h post TR band.   Of note, 6h HL 0.25 on 950 units/hr.  Received 4000 units in cath @1845 .   10/7 AM update:  TR band off  Goal of Therapy:  Heparin level 0.3-0.7 units/ml Monitor platelets by anticoagulation protocol: Yes   Plan:  Start heparin at 1050  units/hr Heparin level in 6-8 hours  Lynwood Mckusick, PharmD, BCPS Clinical Pharmacist Phone: [phone]

## 2024-02-02 NOTE — TOC CM/SW Note (Addendum)
 Transition of Care [facility]) - Inpatient Brief Assessment   Patient Details  Name: Todd Matthews MRN: [id number] Date of Birth: [birth year]  Transition of [facility]) CM/SW Contact:    Luise JAYSON Pan, LCSWA Phone Number: 02/02/2024, 10:05 AM   Clinical Narrative: CSW met patient at bedside and addressed substance use consult placed by MD. Per patient, he does not want resources at this time.    Patient reports he is from home alone. Patient does not have a PCP and was unsure of if he had a pharmacy. Per chart review, patient has several pharmacies listed: [facility] on [address], [facility], [facility] [place] [facility], and Human resources [facility]. Patient is currently uninsured, CSW to reach out to financial counseling for Medicaid screening. Patient has no DME, SNF, or HH hx. Patient states he has transportation to get home or will uber.   12:21 PM Per Antonio with financial counseling, patient is assigned to [facility] for Medicaid Screening.  Transition of Care Asessment: Insurance and Status: Selfpay Patient has primary care physician: No Home environment has been reviewed: Home alone Prior level of function:: Independent Prior/Current Home Services: No current home services Social Drivers of Health Review: SDOH reviewed no interventions necessary Readmission risk has been reviewed: Yes Transition of care needs: transition of care needs identified, TOC will continue to follow

## 2024-02-03 ENCOUNTER — Other Ambulatory Visit (HOSPITAL_COMMUNITY): Payer: Self-pay

## 2024-02-03 LAB — COMPREHENSIVE METABOLIC PANEL WITH GFR
ALT: 24 U/L (ref 0–44)
AST: 54 U/L — ABNORMAL HIGH (ref 15–41)
Albumin: 3 g/dL — ABNORMAL LOW (ref 3.5–5.0)
Alkaline Phosphatase: 57 U/L (ref 38–126)
Anion gap: 10 (ref 5–15)
BUN: <5 mg/dL — ABNORMAL LOW (ref 6–20)
CO2: 23 mmol/L (ref 22–32)
Calcium: 8.5 mg/dL — ABNORMAL LOW (ref 8.9–10.3)
Chloride: 106 mmol/L (ref 98–111)
Creatinine, Ser: 0.84 mg/dL (ref 0.61–1.24)
GFR, Estimated: >60 mL/min (ref >60)
Glucose, Bld: 105 mg/dL — ABNORMAL HIGH (ref 70–99)
Potassium: 3.7 mmol/L (ref 3.5–5.1)
Sodium: 139 mmol/L (ref 135–145)
Total Bilirubin: 0.5 mg/dL (ref 0.0–1.2)
Total Protein: 5.5 g/dL — ABNORMAL LOW (ref 6.5–8.1)

## 2024-02-03 LAB — MAGNESIUM: Magnesium: 2.3 mg/dL (ref 1.7–2.4)

## 2024-02-03 LAB — CBC
HCT: 33.4 % — ABNORMAL LOW (ref 39.0–52.0)
Hemoglobin: 11.9 g/dL — ABNORMAL LOW (ref 13.0–17.0)
MCH: 36.8 pg — ABNORMAL HIGH (ref 26.0–34.0)
MCHC: 35.6 g/dL (ref 30.0–36.0)
MCV: 103.4 fL — ABNORMAL HIGH (ref 80.0–100.0)
Platelets: 162 K/uL (ref 150–400)
RBC: 3.23 MIL/uL — ABNORMAL LOW (ref 4.22–5.81)
RDW: 13.5 % (ref 11.5–15.5)
WBC: 8.2 K/uL (ref 4.0–10.5)
nRBC: 0 % (ref 0.0–0.2)

## 2024-02-03 LAB — LIPOPROTEIN A (LPA): Lipoprotein (a): 29.9 nmol/L (ref <75.0)

## 2024-02-03 LAB — HEPARIN LEVEL (UNFRACTIONATED): Heparin Unfractionated: 0.16 [IU]/mL — ABNORMAL LOW (ref 0.30–0.70)

## 2024-02-03 MED ORDER — CARVEDILOL 12.5 MG PO TABS
12.5000 mg | ORAL_TABLET | Freq: Two times a day (BID) | ORAL | 3 refills | Status: AC
  Filled 2024-02-03: qty 60, 30d supply, fill #0

## 2024-02-03 MED ORDER — ATORVASTATIN CALCIUM 40 MG PO TABS
40.0000 mg | ORAL_TABLET | Freq: Every day | ORAL | 3 refills | Status: AC
  Filled 2024-02-03: qty 30, 30d supply, fill #0

## 2024-02-03 MED ORDER — AMLODIPINE BESYLATE 5 MG PO TABS
5.0000 mg | ORAL_TABLET | Freq: Every day | ORAL | 3 refills | Status: AC
  Filled 2024-02-03: qty 30, 30d supply, fill #0

## 2024-02-03 MED ORDER — CLOPIDOGREL BISULFATE 75 MG PO TABS
75.0000 mg | ORAL_TABLET | Freq: Every day | ORAL | 3 refills | Status: AC
  Filled 2024-02-03: qty 30, 30d supply, fill #0

## 2024-02-03 MED ORDER — ASPIRIN 81 MG PO TBEC
81.0000 mg | DELAYED_RELEASE_TABLET | Freq: Every day | ORAL | 12 refills | Status: AC
  Filled 2024-02-03: qty 30, 30d supply, fill #0

## 2024-02-03 MED ORDER — NITROGLYCERIN 0.4 MG SL SUBL
0.4000 mg | SUBLINGUAL_TABLET | SUBLINGUAL | 1 refills | Status: AC | PRN
  Filled 2024-02-03: qty 25, 5d supply, fill #0

## 2024-02-03 NOTE — Progress Notes (Signed)
 Progress Note  Patient Name: Todd Matthews Date of Encounter: 02/03/2024 [facility] Cardiologist: Esaias Cleavenger  Interval Summary   No angina or dyspnea overnight.  Low-grade temp last evening, but none this morning.  No other symptoms of infection. Had a rather lengthy episode of monomorphic VT of around 40 beats at 11 AM yesterday morning, but none since then. Has been receiving occasional doses of Ativan  but has not had overt withdrawal. Gradually weaned off IV nitroglycerin this morning, blood pressure still well-controlled and no chest discomfort.  Vital Signs Vitals:   02/02/24 1900 02/02/24 2331 02/03/24 0354 02/03/24 0730  BP: 129/88 129/71 (!) 128/91 119/72  Pulse: 82 77 75   Resp: 18 17 18 18   Temp: (!) 100.5 F (38.1 C) (!) 100.7 F (38.2 C) 99.5 F (37.5 C) 98.8 F (37.1 C)  TempSrc: Oral Oral Oral Oral  SpO2: 98% 96% 98% 98%  Weight:      Height:        Intake/Output Summary (Last 24 hours) at 02/03/2024 0845 Last data filed at 02/03/2024 0805 Gross per 24 hour  Intake 2213.52 ml  Output 950 ml  Net 1263.52 ml      02/01/2024    9:22 PM 02/01/2024    8:58 AM 11/20/2015    7:14 AM  Last 3 Weights  Weight (lbs) 167 lb 11.2 oz 168 lb 14.4 oz 164 lb 6 oz  Weight (kg) 76.068 kg 76.613 kg 74.56 kg      Telemetry/ECG  Normal sinus rhythm- Personally Reviewed  Physical Exam  GEN: No acute distress.   Neck: No JVD Cardiac: RRR, no murmurs, rubs, or gallops.  Respiratory: Clear to auscultation bilaterally. GI: Soft, nontender, non-distended  MS: No edema  Assessment & Plan   38 year old man with premature onset CAD presenting with small NSTEMI due to occlusion of a small PLV branch of the dominant right coronary artery, preserved left ventricular systolic function, transient minimally symptomatic nonsustained VT in the first 24 hours.  Incidentally found to have a 4.8 cm ascending aortic aneurysm at the level of the sinuses of Valsalva.  Problems with  chronic alcoholism, which have interfered with both his job and relationships.  Has good support from his mother and other members of his family.  DC home with early follow-up. Discussed the critical importance of dual antiplatelet therapy for the next 12 months, then lifelong monotherapy, probably with aspirin.  On moderate dose carvedilol.  Also reviewed the critical importance of conscientious compliance with his medication to avoid beta-blocker rebound.  Currently also on amlodipine for blood pressure control and antianginal benefit, but long-term we may decide to switch him to losartan for its theoretical benefits in preventing ascending aortic aneurysm progression.  On effective statin therapy.  Target LDL cholesterol less than 70.  Also provide sublingual nitroglycerin for any recurrent angina.  Educated in its use.  Have had lengthy discussions regarding alcohol abuse and passed through sobriety.  He has politely repeatedly declined inpatient rehab and is involved in an outpatient educational program.  Early follow-up 2 weeks.  Follow-up with MD in 2-3 months and can repeat lipid profile at that time.  Rescan for ascending aortic aneurysm in 12 months.  Discharge medications:  Amlodipine 5 mg once daily Aspirin 81 mg daily Atorvastatin 40 mg daily Carvedilol 12.5 mg twice daily Clopidogrel 75 mg daily Nitroglycerin 0.4 mg sublingual as needed for chest pain, 1 tablet every 5 minutes to maximum of 3   Medical Readiness Date: 02/03/2024  For questions or updates, please contact [facility] Please consult [email or web address] for contact info under         Signed, Jerel Balding, MD

## 2024-02-03 NOTE — Discharge Summary (Addendum)
 Discharge Summary   Patient ID: Todd Matthews MRN: [id number]; DOB: 1985/10/16  Admit date: 02/01/2024 Discharge date: 02/03/2024  PCP:  Patient, No Pcp Per    [facility] Providers Cardiologist:  Jerel Balding, MD     Discharge Diagnoses  Principal Problem:   NSTEMI (non-ST elevated myocardial infarction) [facility])   Diagnostic Studies/Procedures   Echocardiogram 02/01/24  1. Left ventricular ejection fraction, by estimation, is 50 to 55%. The  left ventricle has low normal function. The left ventricle demonstrates  regional wall motion abnormalities (see scoring diagram/findings for  description). Left ventricular diastolic   parameters were normal.   2. Right ventricular systolic function is normal. The right ventricular  size is normal. There is mildly elevated pulmonary artery systolic  pressure. The estimated right ventricular systolic pressure is 41.0 mmHg.   3. The mitral valve is normal in structure. No evidence of mitral valve  regurgitation. No evidence of mitral stenosis.   4. The aortic valve is tricuspid. Aortic valve regurgitation is trivial.  No aortic stenosis is present.   5. Aortic dilatation noted. There is moderate dilatation of the aortic  root, measuring 47 mm.   6. Normal ascending aorta, measuring 33 mm.   7. The inferior vena cava is dilated in size with <50% respiratory  variability, suggesting right atrial pressure of 15 mmHg.   8. Tricuspid valve regurgitation is mild to moderate.   9. Left atrial size was mildly dilated.   Left Heart Catheterization 02/01/24    Recommend uninterrupted dual antiplatelet therapy with Aspirin 81mg  daily and Clopidogrel 75mg  daily for a minimum of 12 months (ACS-Class I recommendation).   1.  Patent left main with no stenosis 2.  Patent LAD with mild plaquing and no significant stenosis 3.  Patent circumflex/intermediate branches with minimal plaquing and no stenosis 4.  Mild diffuse plaquing of the RCA and  total occlusion of the second posterolateral branch, suspected culprit 5.  Moderately elevated LVEDP   Recommendations: Medical therapy.  DAPT with aspirin and clopidogrel x 12 months.  Occluded branch vessel too small for PCI. _____________   History of Present Illness   Todd Matthews is a 38 y.o. male with no prior cardiac history. He recalls having some sort of stress test at another hospital for a job around 2021 but does not recall the results. He has worked as a Location manager. He typically is very athletic but has not been specifically physically active recently. He currently smokes and drinks slightly less than a fifth of liquor a day. There is a family history of alcoholism. He works as a Location manager. His maternal grandfather died in a cab on the way to the hospital of a heart attack in his 19s. He does not go to PCP regularly but has been told in the past during incidental PRN visits that BP was high. He does not take any regular medicines.   On Saturday around 2pm he noticed about 10 minutes of chest pressure that he had never felt before. It subsided spontaneously the recurred yesterday around the same time, again briefly x 10 minutes, before resolving without intervention. Around 10pm it began to recur and significantly worsen, 8/10, and he was unable to sleep due to feeling so uncomfortable. He tried laying down in many different positions but nothing helped. It seemed a little worse laying on his belly. He does notice some improvement sitting up and leaning forward but doesn't complete yresolved. This was associated with SOB and nausea but no  vomiting. No worsening with inspiration. Due to persistent symptoms, he came to the ED and received 324mg  ASA, Maalox, Toradol, 2 SL NTG, 1L IVF with resolution of symptoms. Initial troponin was elevated to 111 so started on IV heparin, next value 202. K 3.4 so received 40meq KCl x1. He had a 27 beat run of NSVT associated with chest pain and  diaphoresis. The pain began to return this morning rated 3-4/10 prompting an additional SL NTG with gradual improvement, easing off. No recent viral symptoms, fever, chills, syncope, palpitations, vaccinations, edema, exercise intolerance, surgery, travel, bedrest.  CXR NAD, normal mediastinal contours.   Initial EKG showed SB 54bpm, baseline artifact, nonspecific minor ST upsloping inferiorly, TW flattening I, avL, V5-V6. F/u EKG 10:16AM shows NSR 62bpm with slight pronunciation of ST elevation inferiorly and V5-V6 with associated TWI. Repeat EKG during worsening CP appeared similar to 10:16am tracing.   Hospital Course   Consultants: None    Patient was admitted to the cardiology service for evaluation of NSTEMI.  EKG showed subtle ST upsloping, did not meet STEMI criteria but recommended cardiac catheterization.  Started on IV heparin, Lipitor, aspirin.  Underwent echocardiogram 02/01/2024 showed EF 50-55% with regional wall motion abnormalities, normal LV diastolic parameters, normal RV systolic function, mildly elevated PA systolic pressure, no significant valvular abnormalities.  Underwent left heart catheterization on 02/01/2024 that showed patent left main with no stenosis, patent LAD with mild plaquing and no significant stenosis, patent circumflex/intermediate branches with minimal plaquing and no stenosis.  There was mild diffuse plaquing of the RCA and total occlusion of the second posterolateral branch that was suspected to be the culprit.  This branch vessel was too small for PCI.  Recommended medical therapy with aspirin and clopidogrel for 12 months.  Patient was treated with IV heparin for a total of 48 hours.  Was able to be weaned off of IV nitrates on 10/7.  He had a 43 beat run of VT and his carvedilol was increased.  On 10/8, patient was seen by Dr. Francyne.  Patient denied angina or dyspnea overnight.  He had no recurrence of VT on telemetry.  He was deemed stable for  discharge.  Patient has follow up with general cardiology on 10/21.   NSTEMI CAD - Patient presented complaining of chest pain.  High-sensitivity troponin peaked at 425. - Cardiac catheterization showed patent left main with no stenosis, patent LAD with mild plaquing and no significant stenosis, patent circumflex/intermediate branches with minimal plaquing and no stenosis.  There was mild diffuse plaquing of the RCA and total occlusion of the second posterolateral branch that was a suspected culprit.  Occluded branch vessel too small for PCI.  Patient was treated with 48 hours IV heparin and started on DAPT. - Echocardiogram showed EF 50-55% with regional wall motion abnormalities - Continue DAPT with aspirin 81 mg daily and Plavix 75 mg daily for 12 months - Continue amlodipine 5 mg daily. In the future, could consider stopping amlodipine and starting losartan for benefit in preventing aortic aneurysm progression  - Continue carvedilol 12.5 mg twice daily - Continue lipitor 40 mg daily - Discharged with PRN SL nitroglycerin. He was educated on proper use  - Discussed radial site care and provided written instructions on AVS   Alcohol abuse - Patient has declined inpatient rehab, currently involved with outpatient educational program   Ascending aortic aneurysm - Remarkably large for his age at 4.8 cm, but without dissection or aortic insufficiency.  No family history of  aortic aneurysm.  Possibly related to uncontrolled blood pressure  - BP well controlled prior to DC  - Plan to monitor with CTA yearly   Hyperlipidemia - LDL 111 this admission. Target less than 70  - Continue lipitor 40 mg daily  - Repeat lipids, LFTs in 2-3 months   VT - Patient had a 40 beat run of VT on 10/7. Carvedilol was increased to 12.5 mg BID  - Patient did not have repeat VT during this admission.  K3.7, magnesium 2.3 prior to discharge     Did the patient have an acute coronary syndrome (MI, NSTEMI,  STEMI, etc) this admission?:  Yes                               AHA/ACC ACS Clinical Performance & Quality Measures: Aspirin prescribed? - Yes ADP Receptor Inhibitor (Plavix/Clopidogrel, Brilinta/Ticagrelor or Effient/Prasugrel) prescribed (includes medically managed patients)? - Yes Beta Blocker prescribed? - Yes High Intensity Statin (Lipitor 40-80mg  or Crestor 20-40mg ) prescribed? - Yes EF assessed during THIS hospitalization? - Yes For EF <40%, was ACEI/ARB prescribed? - Not Applicable (EF >/= 40%) For EF <40%, Aldosterone Antagonist (Spironolactone or Eplerenone) prescribed? - Not Applicable (EF >/= 40%) Cardiac Rehab Phase II ordered (including medically managed patients)? - Yes     _____________  Discharge Vitals Blood pressure 119/74, pulse 72, temperature 99.2 F (37.3 C), temperature source Oral, resp. rate 18, height 6' (1.829 m), weight 76.1 kg, SpO2 95%.  Filed Weights   02/01/24 0858 02/01/24 2122  Weight: 76.6 kg 76.1 kg    Labs & Radiologic Studies  CBC Recent Labs    02/02/24 0247 02/03/24 0256  WBC 7.3 8.2  HGB 12.0* 11.9*  HCT 34.3* 33.4*  MCV 104.9* 103.4*  PLT 172 162   Basic Metabolic Panel Recent Labs    89/92/74 0247 02/03/24 0256  NA 138 139  K 3.5 3.7  CL 107 106  CO2 24 23  GLUCOSE 122* 105*  BUN <5* <5*  CREATININE 0.93 0.84  CALCIUM 8.4* 8.5*  MG 1.9 2.3   Liver Function Tests Recent Labs    02/01/24 0851 02/03/24 0256  AST 56* 54*  ALT 23 24  ALKPHOS 64 57  BILITOT 0.4 0.5  PROT 6.1* 5.5*  ALBUMIN 4.0 3.0*   No results for input(s): LIPASE, AMYLASE in the last 72 hours. High Sensitivity Troponin:   No results for input(s): TROPONINIHS in the last 720 hours.  Recent Labs  Lab 02/01/24 0638 02/01/24 0851 02/01/24 1122 02/01/24 1240  TRNPT 111* 202* 367* 425*    BNP Invalid input(s): POCBNP No results for input(s): PROBNP in the last 72 hours.  No results for input(s): BNP in the last 72 hours.   D-Dimer No results for input(s): DDIMER in the last 72 hours. Hemoglobin A1C Recent Labs    02/02/24 0247  HGBA1C 4.7*   Fasting Lipid Panel Recent Labs    02/02/24 0247  CHOL 184  HDL 58  LDLCALC 111*  TRIG 76  CHOLHDL 3.2   Lipoprotein (a)  Date/Time Value Ref Range Status  02/02/2024 02:47 AM 29.9 <75.0 nmol/L Final    Comment:    (NOTE) This test was developed and its performance characteristics determined by [facility]. It has not been cleared or approved by the Food and Drug Administration. Note:  Values greater than or equal to 75.0 nmol/L may       indicate an independent risk  factor for CHD,       but must be evaluated with caution when applied       to non-Caucasian populations due to the       influence of genetic factors on Lp(a) across       ethnicities. Performed At: [facility] [address] [place], [place] [id number] Jennette Shorter MD Ey:[phone]     Thyroid Function Tests Recent Labs    02/01/24 1222  TSH 1.030   _____________  CARDIAC CATHETERIZATION Result Date: 02/02/2024   Recommend uninterrupted dual antiplatelet therapy with Aspirin 81mg  daily and Clopidogrel 75mg  daily for a minimum of 12 months (ACS-Class I recommendation). 1.  Patent left main with no stenosis 2.  Patent LAD with mild plaquing and no significant stenosis 3.  Patent circumflex/intermediate branches with minimal plaquing and no stenosis 4.  Mild diffuse plaquing of the RCA and total occlusion of the second posterolateral branch, suspected culprit 5.  Moderately elevated LVEDP Recommendations: Medical therapy.  DAPT with aspirin and clopidogrel x 12 months.  Occluded branch vessel too small for PCI.   CT Angio Chest/Abd/Pel for Dissection W and/or W/WO Result Date: 02/01/2024 CLINICAL DATA:  Acute aortic syndrome suspected, chest pain EXAM: CT ANGIOGRAPHY CHEST, ABDOMEN AND PELVIS TECHNIQUE: Non-contrast CT of the chest was initially obtained. Multidetector CT  imaging through the chest, abdomen and pelvis was performed using the standard protocol during bolus administration of intravenous contrast. Multiplanar reconstructed images and MIPs were obtained and reviewed to evaluate the vascular anatomy. RADIATION DOSE REDUCTION: This exam was performed according to the departmental dose-optimization program which includes automated exposure control, adjustment of the mA and/or kV according to patient size and/or use of iterative reconstruction technique. CONTRAST:  100mL OMNIPAQUE IOHEXOL 350 MG/ML SOLN COMPARISON:  None Available. FINDINGS: CTA CHEST FINDINGS VASCULAR Aorta: Satisfactory opacification of the aorta. 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 Aortic valve measures up to 3.5 cm. Tubular ascending thoracic aorta measures up to 3.1 x 3.1 cm. No evidence of dissection or other acute aortic pathology. Cardiovascular: No evidence of pulmonary embolism on limited non-tailored examination. Normal heart size. Scattered left and right coronary artery calcifications. No pericardial effusion. Review of the MIP images confirms the above findings. NON VASCULAR Mediastinum/Nodes: No enlarged mediastinal, hilar, or axillary lymph nodes. Thyroid gland, trachea, and esophagus demonstrate no significant findings. Lungs/Pleura: Mild dependent bibasilar scarring or atelectasis. No pleural effusion or pneumothorax. Musculoskeletal: No chest wall abnormality. No acute osseous findings. Review of the MIP images confirms the above findings. CTA ABDOMEN AND PELVIS FINDINGS VASCULAR Normal contour and caliber of the abdominal aorta. No evidence of aneurysm, dissection, or other acute aortic pathology. Standard branching pattern of the abdominal aorta with solitary bilateral renal arteries. Review of the MIP images confirms the above findings. NON-VASCULAR Hepatobiliary: No solid liver abnormality is seen. Contracted gallbladder. No gallstones, gallbladder  wall thickening, or biliary dilatation. Pancreas: Unremarkable. No pancreatic ductal dilatation or surrounding inflammatory changes. Spleen: Normal in size without significant abnormality. Adrenals/Urinary Tract: Adrenal glands are unremarkable. Kidneys are normal, without renal calculi, solid lesion, or hydronephrosis. Bladder is unremarkable. Stomach/Bowel: Stomach is within normal limits. Appendix appears normal. No evidence of bowel wall thickening, distention, or inflammatory changes. Lymphatic: No enlarged abdominal or pelvic lymph nodes. Reproductive: No mass or other significant abnormality. Other: No abdominal wall hernia or abnormality. No ascites. Musculoskeletal: No acute osseous findings. IMPRESSION: 1. Enlargement of the sinuses of Valsalva measuring up to 4.8  cm in caliber. Aortic valve measures up to 3.5 cm. Tubular ascending thoracic aorta measures up to 3.1 x 3.1 cm. No evidence of dissection or other acute aortic pathology. 2. Normal contour and caliber of the abdominal aorta. No evidence of aneurysm, dissection, or other acute aortic pathology. 3. Coronary artery disease, advanced for patient age. Electronically Signed   By: Marolyn JONETTA Jaksch M.D.   On: 02/01/2024 15:33   ECHOCARDIOGRAM COMPLETE Result Date: 02/01/2024    ECHOCARDIOGRAM REPORT   Patient Name:   COULTON SCHLINK Date of Exam: 02/01/2024 Medical Rec #:  [id number]    Height:       72.0 in Accession #:    [id number]   Weight:       168.9 lb Date of Birth:  09/26/1985   BSA:          1.983 m Patient Age:    37 years     BP:           146/107 mmHg Patient Gender: M            HR:           61 bpm. Exam Location:  Inpatient Procedure: 2D Echo, Cardiac Doppler and Color Doppler (Both Spectral and Color            Flow Doppler were utilized during procedure). STAT ECHO Indications:    Elevated Troponin R79.89  History:        Patient has no prior history of Echocardiogram examinations.  Sonographer:    BERNARDA ROCKS Referring Phys: [id number] DAYNA  N DUNN IMPRESSIONS  1. Left ventricular ejection fraction, by estimation, is 50 to 55%. The left ventricle has low normal function. The left ventricle demonstrates regional wall motion abnormalities (see scoring diagram/findings for description). Left ventricular diastolic  parameters were normal.  2. Right ventricular systolic function is normal. The right ventricular size is normal. There is mildly elevated pulmonary artery systolic pressure. The estimated right ventricular systolic pressure is 41.0 mmHg.  3. The mitral valve is normal in structure. No evidence of mitral valve regurgitation. No evidence of mitral stenosis.  4. The aortic valve is tricuspid. Aortic valve regurgitation is trivial. No aortic stenosis is present.  5. Aortic dilatation noted. There is moderate dilatation of the aortic root, measuring 47 mm.  6. Normal ascending aorta, measuring 33 mm.  7. The inferior vena cava is dilated in size with <50% respiratory variability, suggesting right atrial pressure of 15 mmHg.  8. Tricuspid valve regurgitation is mild to moderate.  9. Left atrial size was mildly dilated. FINDINGS  Left Ventricle: Left ventricular ejection fraction, by estimation, is 50 to 55%. The left ventricle has low normal function. The left ventricle demonstrates regional wall motion abnormalities. The left ventricular internal cavity size was normal in size. There is no left ventricular hypertrophy. Left ventricular diastolic parameters were normal.  LV Wall Scoring: The basal inferior segment and basal inferoseptal segment are akinetic. Right Ventricle: The right ventricular size is normal. No increase in right ventricular wall thickness. Right ventricular systolic function is normal. There is mildly elevated pulmonary artery systolic pressure. The tricuspid regurgitant velocity is 2.55  m/s, and with an assumed right atrial pressure of 15 mmHg, the estimated right ventricular systolic pressure is 41.0 mmHg. Left Atrium: Left  atrial size was mildly dilated. Right Atrium: Right atrial size was normal in size. Pericardium: There is no evidence of pericardial effusion. Mitral Valve: The mitral valve is normal in structure. No evidence  of mitral valve regurgitation. No evidence of mitral valve stenosis. MV peak gradient, 2.3 mmHg. The mean mitral valve gradient is 1.0 mmHg. Tricuspid Valve: The tricuspid valve is normal in structure. Tricuspid valve regurgitation is mild to moderate. No evidence of tricuspid stenosis. Aortic Valve: The aortic valve is tricuspid. Aortic valve regurgitation is trivial. No aortic stenosis is present. Aortic valve mean gradient measures 1.0 mmHg. Aortic valve peak gradient measures 3.5 mmHg. Aortic valve area, by VTI measures 5.12 cm. Pulmonic Valve: The pulmonic valve was normal in structure. Pulmonic valve regurgitation is trivial. No evidence of pulmonic stenosis. Aorta: Aortic dilatation noted. There is moderate dilatation of the aortic root, measuring 47 mm. There is dilatation of the ascending aorta, measuring 33 mm. Venous: The inferior vena cava is dilated in size with less than 50% respiratory variability, suggesting right atrial pressure of 15 mmHg. IAS/Shunts: No atrial level shunt detected by color flow Doppler.  LEFT VENTRICLE PLAX 2D LVIDd:         5.30 cm      Diastology LVIDs:         3.60 cm      LV e' medial:    7.83 cm/s LV PW:         0.90 cm      LV E/e' medial:  9.7 LV IVS:        1.00 cm      LV e' lateral:   9.14 cm/s LVOT diam:     2.60 cm      LV E/e' lateral: 8.3 LV SV:         99 LV SV Index:   50 LVOT Area:     5.31 cm  LV Volumes (MOD) LV vol d, MOD A2C: 151.0 ml LV vol d, MOD A4C: 184.0 ml LV vol s, MOD A2C: 71.9 ml LV vol s, MOD A4C: 68.8 ml LV SV MOD A2C:     79.1 ml LV SV MOD A4C:     184.0 ml LV SV MOD BP:      101.6 ml RIGHT VENTRICLE             IVC RV Basal diam:  3.80 cm     IVC diam: 2.30 cm RV S prime:     13.30 cm/s TAPSE (M-mode): 2.2 cm RVSP:           41.0 mmHg LEFT  ATRIUM             Index        RIGHT ATRIUM            Index LA diam:        3.40 cm 1.71 cm/m   RA Pressure: 15.00 mmHg LA Vol (A2C):   77.9 ml 39.29 ml/m  RA Area:     15.30 cm LA Vol (A4C):   61.8 ml 31.17 ml/m  RA Volume:   37.00 ml   18.66 ml/m LA Biplane Vol: 71.5 ml 36.06 ml/m  AORTIC VALVE                    PULMONIC VALVE AV Area (Vmax):    4.76 cm     PV Vmax:          0.87 m/s AV Area (Vmean):   4.88 cm     PV Peak grad:     3.1 mmHg AV Area (VTI):     5.12 cm     PR End Diast Vel: 2.29 msec AV  Vmax:           94.00 cm/s AV Vmean:          55.500 cm/s AV VTI:            0.193 m AV Peak Grad:      3.5 mmHg AV Mean Grad:      1.0 mmHg LVOT Vmax:         84.20 cm/s LVOT Vmean:        51.000 cm/s LVOT VTI:          0.186 m LVOT/AV VTI ratio: 0.96  AORTA Ao Root diam: 4.70 cm Ao Asc diam:  3.30 cm MITRAL VALVE               TRICUSPID VALVE MV Area (PHT): 2.82 cm    TR Peak grad:   26.0 mmHg MV Area VTI:   3.51 cm    TR Vmax:        255.00 cm/s MV Peak grad:  2.3 mmHg    Estimated RAP:  15.00 mmHg MV Mean grad:  1.0 mmHg    RVSP:           41.0 mmHg MV Vmax:       0.76 m/s MV Vmean:      50.1 cm/s   SHUNTS MV E velocity: 76.20 cm/s  Systemic VTI:  0.19 m MV A velocity: 61.60 cm/s  Systemic Diam: 2.60 cm MV E/A ratio:  1.24 Oneil Parchment MD Electronically signed by Oneil Parchment MD Signature Date/Time: 02/01/2024/2:04:24 PM    Final    DG Chest 2 View Result Date: 02/01/2024 EXAM: 2 VIEW(S) XRAY OF THE CHEST 02/01/2024 06:57:00 AM COMPARISON: None available. CLINICAL HISTORY: Chest pain. Chest pain. Chest pain. Chest pain. FINDINGS: LUNGS AND PLEURA: No focal pulmonary opacity. No pulmonary edema. No pleural effusion. No pneumothorax. HEART AND MEDIASTINUM: No acute abnormality of the cardiac and mediastinal silhouettes. BONES AND SOFT TISSUES: No acute osseous abnormality. IMPRESSION: 1. No acute process. Electronically signed by: Waddell Calk MD 02/01/2024 07:02 AM EDT RP Workstation: [id number]     Disposition Pt is being discharged home today in good condition.  Follow-up Plans & Appointments  Discharge Instructions     AMB referral to Phase II Cardiac Rehabilitation   Complete by: As directed    Diagnosis: NSTEMI   After initial evaluation and assessments completed: Virtual Based Care may be provided alone or in conjunction with Phase 2 Cardiac Rehab based on patient barriers.: Yes   Intensive Cardiac Rehabilitation (ICR) [facility] location only OR Traditional Cardiac Rehabilitation (TCR) *If criteria for ICR are not met will enroll in TCR [facility] only): Yes   Diet - low sodium heart healthy   Complete by: As directed    Discharge instructions   Complete by: As directed    PLEASE DO NOT MISS ANY DOSES OF YOUR PLAVIX!!!!! Also keep a log of you blood pressures and bring back to your follow up appt. Please call the office with any questions.   Patients taking blood thinners should generally stay away from medicines like ibuprofen, Advil, Motrin, naproxen, and Aleve due to risk of stomach bleeding. You may take Tylenol  as directed or talk to your primary doctor about alternatives.  PLEASE ENSURE THAT YOU DO NOT RUN OUT OF YOUR PLAVIX. This medication is very important to remain on for at least one year. IF you have issues obtaining this medication due to cost please CALL the office 3-5 business days prior to running  out in order to prevent missing doses of this medication.   Radial Site Care Refer to this sheet in the next few weeks. These instructions provide you with information on caring for yourself after your procedure. Your caregiver may also give you more specific instructions. Your treatment has been planned according to current medical practices, but problems sometimes occur. Call your caregiver if you have any problems or questions after your procedure. HOME CARE INSTRUCTIONS You may shower the day after the procedure. Remove the bandage (dressing) and gently wash the site with  plain soap and water . Gently pat the site dry.  Do not apply powder or lotion to the site.  Do not submerge the affected site in water  for 3 to 5 days.  Inspect the site at least twice daily.  Do not flex or bend the affected arm for 24 hours.  No lifting over 5 pounds (2.3 kg) for 5 days after your procedure.  Do not drive home if you are discharged the same day of the procedure. Have someone else drive you.  You may drive 24 hours after the procedure unless otherwise instructed by your caregiver.  What to expect: Any bruising will usually fade within 1 to 2 weeks.  Blood that collects in the tissue (hematoma) may be painful to the touch. It should usually decrease in size and tenderness within 1 to 2 weeks.  SEEK IMMEDIATE MEDICAL CARE IF: You have unusual pain at the radial site.  You have redness, warmth, swelling, or pain at the radial site.  You have drainage (other than a small amount of blood on the dressing).  You have chills.  You have a fever or persistent symptoms for more than 72 hours.  You have a fever and your symptoms suddenly get worse.  Your arm becomes pale, cool, tingly, or numb.  You have heavy bleeding from the site. Hold pressure on the site.   Increase activity slowly   Complete by: As directed        Discharge Medications Allergies as of 02/03/2024   No Known Allergies      Medication List     TAKE these medications    amLODipine 5 MG tablet Commonly known as: NORVASC Take 1 tablet (5 mg total) by mouth daily. Start taking on: February 04, 2024   aspirin EC 81 MG tablet Take 1 tablet (81 mg total) by mouth daily. Swallow whole. Start taking on: February 04, 2024   atorvastatin 40 MG tablet Commonly known as: LIPITOR Take 1 tablet (40 mg total) by mouth daily. Start taking on: February 04, 2024   carvedilol 12.5 MG tablet Commonly known as: COREG Take 1 tablet (12.5 mg total) by mouth 2 (two) times daily with a meal.   clopidogrel 75 MG  tablet Commonly known as: PLAVIX Take 1 tablet (75 mg total) by mouth daily. Start taking on: February 04, 2024   nitroGLYCERIN 0.4 MG SL tablet Commonly known as: NITROSTAT Place 1 tablet (0.4 mg total) under the tongue every 5 (five) minutes x 3 doses as needed for chest pain.         Outstanding Labs/Studies   Duration of Discharge Encounter: APP Time: 20 minutes   Signed, Rollo FABIENE Louder, PA-C 02/03/2024, 1:39 PM  I have seen and examined the patient along with Rollo FABIENE Louder, PA-C.  I have reviewed the chart, notes and new data.  I agree with PA/NP's note.  Key new complaints: Roughly 48 hours after his presentation he  no longer has had any problems with angina.  He was aware of palpitations but he had a lengthy episode of monomorphic ventricular tachycardia lasting about 40 beats yesterday, but has not had any palpitations since.  Denies any problems with shortness of breath.  No issues at the cardiac catheterization access site. Key examination changes: Clear lungs, normal jugular venous pulsations and no lower extremity edema, regular rate and rhythm normal S1 and S2, no gallops or murmurs.  Healthy radial access site. Key new findings / data: No further arrhythmia on telemetry.  Renal function.  Mild elevation in AST with normal ALT.  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  Normal hemoglobin A1c.  Lipid profile shows LDL above target range at 888.  PLAN: Lengthy discussion about the importance of antiplatelet therapy, although this can be interrupted sooner if necessary since he did not receive angioplasty or stent.  Will plan aspirin plus clopidogrel for 12 months and then we will switch to monotherapy. Talked about the benefits of beta-blocker therapy but also the risks of abrupt withdrawal and beta-blocker rebound. Instructed in the appropriate use of sublingual nitroglycerin for angina and when he  should seek urgent medical attention. Benefits of lipid-lowering therapy to reduce further plaque formation and reduce the risk of future events reviewed.  Repeat a lipid profile in 2-3 months. Reviewed the need for at least yearly monitoring of the diameter of the ascending aorta.  Currently he is on beta-blockers and amlodipine for antianginal benefit, but down the road plan to substitute losartan instead of the amlodipine, for theoretical benefit in reducing the rate of aneurysm expansion. Requested social work support and offered referral to inpatient alcohol rehab.  He is not ready for this at that time. Strongly recommended permanent smoking cessation. Patient's mother was present throughout these discussions.  Total physician discharge time 40 minutes  Jerel Balding, MD, FACC [facility] [phone] 02/03/2024, 2:47 PM

## 2024-02-03 NOTE — Progress Notes (Signed)
 PHARMACY - ANTICOAGULATION CONSULT NOTE  Pharmacy Consult for heparin Indication: chest pain/ACS  No Known Allergies  Patient Measurements: Height: 6' (182.9 cm) Weight: 76.1 kg (167 lb 11.2 oz) IBW/kg (Calculated) : 77.6 HEPARIN DW (KG): 76.1  Vital Signs: Temp: 99.5 F (37.5 C) (10/08 0354) Temp Source: Oral (10/08 0354) BP: 128/91 (10/08 0354) Pulse Rate: 75 (10/08 0354)  Labs: Recent Labs    02/01/24 0638 02/01/24 1525 02/02/24 0247 02/02/24 1058 02/02/24 1928 02/03/24 0256  HGB 13.8  --  12.0*  --   --  11.9*  HCT 40.1  --  34.3*  --   --  33.4*  PLT 256  --  172  --   --  162  HEPARINUNFRC  --    < >  --  0.10* 0.28* 0.16*  CREATININE 0.90  --  0.93  --   --  0.84   < > = values in this interval not displayed.    Estimated Creatinine Clearance: 130.9 mL/min (by C-G formula based on SCr of 0.84 mg/dL).   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 No anticoagulation noted PTA. Pharmacy consulted for heparin dosing in setting of ACS/STEMI.    Heparin level 0.16 is subtherapeutic on 1300 units/hr. Plan to discontinue heparin on 10/8.  Goal of Therapy:  Heparin level 0.3-0.7 units/ml Monitor platelets by anticoagulation protocol: Yes   Plan:  Heparin increase to 1600 units/hr  Monitor daily heparin level, CBC, signs/symptoms of bleeding  Stop heparin 10/8   Jinnie Door, PharmD, BCPS, [facility] Clinical Pharmacist  Please check AMION for all [facility] phone numbers After 10:00 PM, call Main Pharmacy [phone]

## 2024-02-03 NOTE — Progress Notes (Signed)
 Pt sts he has been walking in room, declines walking now. Eager to d/c. Discussed with pt and mother MI, restrictions, smoking cessation, diet, exercise, NTG, and CRPII. Pt receptive. Sts he might be able to cut down on smoking but not ready to quit yet. Discussed the risks of vaping as well. Will refer to [facility] CRPII however he will need Medicaid first.  [id number] Aliene Aris BS, ACSM-CEP 02/03/2024 2:20 PM

## 2024-02-03 NOTE — Plan of Care (Signed)
  Problem: Education: Goal: Understanding of cardiac disease, CV risk reduction, and recovery process will improve Outcome: Progressing   

## 2024-02-03 NOTE — Progress Notes (Signed)
 Discharge Nurse Summary: DC order noted per MD. DC RN at bedside with patient. Patient agreeable with discharge plan, mother at the bedside for transport home. AVS printed/reviewed. Discussed importance of med compliance, dietary recommendations, and benefit of pillbox. PIVs removed, skin intact. No DME needs. No home meds. TOC meds pending pickup. CP/Edu resolved. Telemonitor returned to charging station. All belongings accounted for. R radial site, CDI w/o bleeding or drainage. See LDAs. Patient wheeled downstairs with volunteer transport for discharge by private auto. Instructed to ensure TOC meds are picked up.    Rosario EMERSON Lund, RN

## 2024-02-05 ENCOUNTER — Telehealth (HOSPITAL_COMMUNITY): Payer: Self-pay

## 2024-02-05 NOTE — Telephone Encounter (Signed)
Pt is not interested in the cardiac rehab program. Closed referral 

## 2024-02-15 NOTE — Progress Notes (Deleted)
 Cardiology Office Note:    Date:  02/15/2024   ID:  Todd Matthews, DOB Sep 17, 1985, MRN [id number]  PCP:  Patient, No Pcp Per   [facility] Providers Cardiologist:  Jerel Balding, MD { Click to update primary MD,subspecialty MD or APP then REFRESH:1}    Referring MD: No ref. provider found   Chief complaint: Follow-up hospital admission for NSTEMI     History of Present Illness:   Todd Matthews is a 38 y.o. male with a hx of threatening behavior towards medical staff, NSTEMI presenting to the office today for follow-up of recent hospital admission 2/2 NSTEMI.  Patient presented to the ED on 02/01/2024 complaining of substernal chest pain starting the night before.  EKG was sinus rhythm, short PR interval, nonspecific T wave abnormalities in the lateral leads, possible right atrial enlargement, 54 bpm.  Pain improved after nitroglycerin administration. CXR NAD, normal mediastinal contours.  Initial troponin 111>>> 202, patient admitted to cardiology service.    Echo 02/01/2024 showed LVEF 50-55%, low normal LV function, RWMA present with basal inferior and basal inferoseptal segment akinesis.  Normal RV.  Mildly elevated PASP, RVSP 41 mmHg.  Normal mitral valve.  AV regurgitation trivial.  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  Normal ascending aorta measuring 33 mm.  RA pressure 15 mmHg.  Tricuspid regurgitation mild-moderate.  LA mildly dilated.  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  Patent LAD with mild plaquing no significant stenosis, patent circumflex/intermediate branches with minimal plaquing to no stenosis.  Moderately elevated LVEDP.  Medical therapy recommended, DAPT with aspirin and clopidogrel X 12 months, occluded branch vessel too small for PCI.  Patient was weaned off IV nitrates on 02/02/2024, he had a 43 beat run of VT and his carvedilol was increased.   Patient was deemed stable for discharge on 02/03/2024 due to no recurrence of VT on telemetry.  ROS:   Please see the history of present illness.    *** All other systems reviewed and are negative.     Past Medical History:  Diagnosis Date   Alcohol use    Genital warts    Seasonal allergies     Past Surgical History:  Procedure Laterality Date   LEFT HEART CATH AND CORONARY ANGIOGRAPHY N/A 02/01/2024   Procedure: LEFT HEART CATH AND CORONARY ANGIOGRAPHY;  Surgeon: Wonda Sharper, MD;  Location: [facility] INVASIVE CV LAB;  Service: Cardiovascular;  Laterality: N/A;   NO PAST SURGERIES      Current Medications: No outpatient medications have been marked as taking for the 02/16/24 encounter (Appointment) with Dunn, Dayna N, PA-C.     Allergies:   Patient has no known allergies.   Social History   Socioeconomic History   Marital status: Single    Spouse name: Not on file   Number of children: Not on file   Years of education: Not on file   Highest education level: Not on file  Occupational History   Not on file  Tobacco Use   Smoking status: Never   Smokeless tobacco: Never  Substance and Sexual Activity   Alcohol use: Yes    Alcohol/week: 0.0 standard drinks of alcohol    Comment: liquor -- pint of liquor/day x 2 years   Drug use: Yes    Types: Marijuana   Sexual activity: Yes    Partners: Female    Birth control/protection: None  Other Topics Concern  Not on file  Social History Narrative   Not on file   Social Drivers of Health   Financial Resource Strain: Not on file  Food Insecurity: No Food Insecurity (02/02/2024)   Hunger Vital Sign    Worried About Running Out of Food in the Last Year: Never true    Ran Out of Food in the Last Year: Never true  Transportation Needs: No Transportation Needs (02/02/2024)   PRAPARE - Administrator, Civil Service (Medical): No    Lack of Transportation (Non-Medical): No  Physical Activity: Not on file  Stress:  Not on file  Social Connections: Not on file     Family History: The patient's ***family history includes Anxiety disorder in his sister; Healthy in his daughter, mother, sister, and son; Hyperlipidemia in an other family member; Hypertension in his father and paternal grandmother; Stroke in his paternal grandmother.  EKGs/Labs/Other Studies Reviewed:    The following studies were reviewed today: ***      Recent Labs: 02/01/2024: TSH 1.030 02/03/2024: ALT 24; BUN <5; Creatinine, Ser 0.84; Hemoglobin 11.9; Magnesium 2.3; Platelets 162; Potassium 3.7; Sodium 139  Recent Lipid Panel    Component Value Date/Time   CHOL 184 02/02/2024 0247   TRIG 76 02/02/2024 0247   HDL 58 02/02/2024 0247   CHOLHDL 3.2 02/02/2024 0247   VLDL 15 02/02/2024 0247   LDLCALC 111 (H) 02/02/2024 0247     Risk Assessment/Calculations:   {Does this patient have ATRIAL FIBRILLATION?:[phone]}  No BP recorded.  {Refresh Note OR Click here to enter BP  :1}***         Physical Exam:    VS:  There were no vitals taken for this visit.       Wt Readings from Last 3 Encounters:  02/01/24 167 lb 11.2 oz (76.1 kg)  11/20/15 164 lb 6 oz (74.6 kg)  10/16/15 163 lb (73.9 kg)     GEN: *** Well nourished, well developed in no acute distress HEENT: Normal NECK: No JVD; No carotid bruits CARDIAC: *** S1-S2 normal, RRR, no murmurs, rubs, gallops RESPIRATORY:  Clear to auscultation without rales, wheezing or rhonchi  MUSCULOSKELETAL:  No edema; No deformity  SKIN: Warm and dry NEUROLOGIC:  Alert and oriented x 3 PSYCHIATRIC:  Normal affect       Assessment & Plan        {Are you ordering a CV Procedure (e.g. stress test, cath, DCCV, TEE, etc)?   Press F2        :[id number]}    Medication Adjustments/Labs and Tests Ordered: Current medicines are reviewed at length with the patient today.  Concerns regarding medicines are outlined above.  No orders of the defined types were placed in this  encounter.  No orders of the defined types were placed in this encounter.   There are no Patient Instructions on file for this visit.   Signed, Miriam FORBES Shams, NP  02/15/2024 8:53 PM    [facility]

## 2024-02-16 ENCOUNTER — Ambulatory Visit: Payer: MEDICAID | Attending: Physician Assistant | Admitting: Physician Assistant

## 2024-02-17 ENCOUNTER — Encounter: Payer: Self-pay | Admitting: Physician Assistant
